# Patient Record
Sex: Male | Born: 1955 | Race: White | Hispanic: No | Marital: Married | Smoking: Current every day smoker
Health system: Southern US, Community
[De-identification: ages and names within clinical notes are randomized; demographics above are authoritative.]

## PROBLEM LIST (undated history)

## (undated) DIAGNOSIS — F329 Major depressive disorder, single episode, unspecified: Secondary | ICD-10-CM

## (undated) DIAGNOSIS — F32A Depression, unspecified: Secondary | ICD-10-CM

## (undated) DIAGNOSIS — E785 Hyperlipidemia, unspecified: Secondary | ICD-10-CM

## (undated) DIAGNOSIS — F419 Anxiety disorder, unspecified: Secondary | ICD-10-CM

## (undated) DIAGNOSIS — IMO0001 Reserved for inherently not codable concepts without codable children: Secondary | ICD-10-CM

## (undated) DIAGNOSIS — F1020 Alcohol dependence, uncomplicated: Secondary | ICD-10-CM

## (undated) DIAGNOSIS — I10 Essential (primary) hypertension: Secondary | ICD-10-CM

## (undated) DIAGNOSIS — B1910 Unspecified viral hepatitis B without hepatic coma: Secondary | ICD-10-CM

## (undated) DIAGNOSIS — K635 Polyp of colon: Secondary | ICD-10-CM

## (undated) HISTORY — DX: Reserved for inherently not codable concepts without codable children: IMO0001

## (undated) HISTORY — PX: WISDOM TOOTH EXTRACTION: SHX21

## (undated) HISTORY — DX: Depression, unspecified: F32.A

## (undated) HISTORY — DX: Polyp of colon: K63.5

## (undated) HISTORY — DX: Unspecified viral hepatitis B without hepatic coma: B19.10

## (undated) HISTORY — DX: Anxiety disorder, unspecified: F41.9

## (undated) HISTORY — DX: Essential (primary) hypertension: I10

## (undated) HISTORY — DX: Alcohol dependence, uncomplicated: F10.20

## (undated) HISTORY — DX: Hyperlipidemia, unspecified: E78.5

## (undated) HISTORY — DX: Major depressive disorder, single episode, unspecified: F32.9

---
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
## 2001-03-31 ENCOUNTER — Emergency Department (HOSPITAL_COMMUNITY): Admission: EM | Admit: 2001-03-31 | Discharge: 2001-03-31 | Payer: Self-pay | Admitting: Emergency Medicine

## 2013-01-21 ENCOUNTER — Encounter: Payer: Self-pay | Admitting: Physician Assistant

## 2013-01-21 ENCOUNTER — Ambulatory Visit (INDEPENDENT_AMBULATORY_CARE_PROVIDER_SITE_OTHER): Payer: 59 | Admitting: Physician Assistant

## 2013-01-21 VITALS — BP 132/98 | HR 67 | Temp 98.1°F | Resp 18 | Ht 67.0 in | Wt 184.5 lb

## 2013-01-21 DIAGNOSIS — F341 Dysthymic disorder: Secondary | ICD-10-CM

## 2013-01-21 DIAGNOSIS — I10 Essential (primary) hypertension: Secondary | ICD-10-CM

## 2013-01-21 DIAGNOSIS — Z0000 Encounter for general adult medical examination without abnormal findings: Secondary | ICD-10-CM

## 2013-01-21 DIAGNOSIS — F329 Major depressive disorder, single episode, unspecified: Secondary | ICD-10-CM

## 2013-01-21 DIAGNOSIS — F419 Anxiety disorder, unspecified: Secondary | ICD-10-CM

## 2013-01-21 DIAGNOSIS — M62838 Other muscle spasm: Secondary | ICD-10-CM

## 2013-01-21 DIAGNOSIS — F32A Depression, unspecified: Secondary | ICD-10-CM

## 2013-01-21 LAB — CBC WITH DIFFERENTIAL/PLATELET
BASOS ABS: 0 10*3/uL (ref 0.0–0.1)
BASOS PCT: 0 % (ref 0–1)
EOS PCT: 2 % (ref 0–5)
Eosinophils Absolute: 0.1 10*3/uL (ref 0.0–0.7)
HCT: 47.3 % (ref 39.0–52.0)
HEMOGLOBIN: 16.3 g/dL (ref 13.0–17.0)
Lymphocytes Relative: 46 % (ref 12–46)
Lymphs Abs: 2.7 10*3/uL (ref 0.7–4.0)
MCH: 29.6 pg (ref 26.0–34.0)
MCHC: 34.5 g/dL (ref 30.0–36.0)
MCV: 85.8 fL (ref 78.0–100.0)
Monocytes Absolute: 0.6 10*3/uL (ref 0.1–1.0)
Monocytes Relative: 9 % (ref 3–12)
Neutro Abs: 2.6 10*3/uL (ref 1.7–7.7)
Neutrophils Relative %: 43 % (ref 43–77)
Platelets: 260 10*3/uL (ref 150–400)
RBC: 5.51 MIL/uL (ref 4.22–5.81)
RDW: 13.2 % (ref 11.5–15.5)
WBC: 6 10*3/uL (ref 4.0–10.5)

## 2013-01-21 LAB — HEMOGLOBIN A1C
HEMOGLOBIN A1C: 5.6 % (ref <5.7)
Mean Plasma Glucose: 114 mg/dL (ref <117)

## 2013-01-21 MED ORDER — CYCLOBENZAPRINE HCL 10 MG PO TABS: 10.0000 mg | ORAL_TABLET | Freq: Three times a day (TID) | ORAL | Status: DC | PRN

## 2013-01-21 MED ORDER — METOPROLOL SUCCINATE ER 25 MG PO TB24: 25.0000 mg | ORAL_TABLET | Freq: Every day | ORAL | Status: DC

## 2013-01-21 MED ORDER — ALPRAZOLAM 0.5 MG PO TABS: 0.2500 mg | ORAL_TABLET | Freq: Two times a day (BID) | ORAL | Status: DC | PRN

## 2013-01-21 NOTE — Patient Instructions (Signed)
Please obtain labs.  I will call you with your results.  Please continue medications as prescribed.  Return in 2 weeks for BP recheck.  Please give some thought to a counselor and possible referral to Psychiatrist.

## 2013-01-21 NOTE — Progress Notes (Signed)
Pre visit review using our clinic review tool, if applicable. No additional management support is needed unless otherwise documented below in the visit note/SLS  

## 2013-01-21 NOTE — Progress Notes (Signed)
Patient ID: Andrew Pollard, male   DOB: November 13, 1955, 58 y.o.   MRN: [id number]  Patient presents to clinic today to establish care.  Acute Concerns: Patient requesting refill of medications.  Chronic Issues: Hypertension -- Patient currently on Toprol-XL.  BP elevated in clinic today.  Patient has not taken medications. Patient denies chest pain, palpitations, vision changes, headache, lightheadedness, dizziness, shortness of breath.  Hx of hyperlipidemia --patient endorses being told he had high cholesterol in the past. Has not been on medication. Is due for fasting lipid panel.  Anxiety and Depression -- patient endorses history of anxiety and depression, mostly stemming from family situations. Patient denies suicidal thought or ideation. Does endorse occasional depressed mood and anhedonia. Endorses generalized anxiety. Denies panic attack. Patient has tried multiple medications in the past, including Lexapro and venlafaxine. Patient states he cannot tolerate the medicines as they called his anxiety to be increased. Endorses suicidal thought wall these medications. Refuses to take an SSRI at this time. Patient has current prescription for Xanax 0.5 mg. Patient states he takes a half to a whole tablet as prescribed. Is able to avoid use of Xanax when he is away from his family. Patient is not currently seeing a therapist. Is amenable to seeing a counselor.   Health Maintenance: Dental -- Overdue Vision -- UTD Immunizations -- Tetanus in 2010.  Declines flu shot. Colonoscopy -- 2011; found colon polyps; benign; repeat colonoscopy in 2016.  Past Medical History  Diagnosis Date  . Colon polyps   . Hypertension   . Hyperlipidemia   . Anxiety and depression   . Hepatitis B 1977  . Alcoholism /alcohol abuse     Past Surgical History  Procedure Laterality Date  . Appendectomy  1970  . Liver biopsy  1977    Hepatitis B  . Wisdom tooth extraction      No current outpatient  prescriptions on file prior to visit.   No current facility-administered medications on file prior to visit.    Allergies  Allergen Reactions  . Sulfa Antibiotics     Family History  Problem Relation Age of Onset  . Aneurysm Mother     Heart  . Hyperlipidemia Mother   . Hypertension Mother   . Epilepsy Brother   . Healthy Brother     x1  . Healthy Son     x2    History   Social History  . Marital Status: Married    Spouse Name: N/A    Number of Children: N/A  . Years of Education: N/A   Occupational History  . Not on file.   Social History Main Topics  . Smoking status: Current Every Day Smoker -- 1.50 packs/day for 30 years  . Smokeless tobacco: Never Used  . Alcohol Use: 6.0 oz/week    10 Cans of beer per week  . Drug Use: No  . Sexual Activity: No   Other Topics Concern  . Not on file   Social History Narrative  . No narrative on file   Review of Systems  Constitutional: Negative for fever and weight loss.  HENT: Negative for ear discharge, ear pain, hearing loss and tinnitus.   Eyes: Negative for blurred vision, double vision, photophobia and pain.  Respiratory: Positive for cough. Negative for hemoptysis, sputum production, shortness of breath and wheezing.   Cardiovascular: Negative for chest pain and palpitations.  Gastrointestinal: Positive for heartburn. Negative for nausea, vomiting, abdominal pain, diarrhea, constipation, blood in stool and melena.  Genitourinary: Negative  for dysuria, urgency, frequency, hematuria and flank pain.       Nocturia x 0  Neurological: Negative for dizziness, seizures, loss of consciousness and headaches.  Endo/Heme/Allergies: Negative for environmental allergies.  Psychiatric/Behavioral: Positive for depression. Negative for suicidal ideas, hallucinations and substance abuse. The patient is nervous/anxious.    BP 138/108  Pulse 67  Temp(Src) 98.1 F (36.7 C) (Oral)  Resp 18  Ht 5\' 7"  (1.702 m)  Wt 184 lb 8 oz  (83.689 kg)  BMI 28.89 kg/m2  SpO2 95%  Physical Exam  Vitals reviewed. Constitutional: He is oriented to person, place, and time and well-developed, well-nourished, and in no distress.  HENT:  Head: Normocephalic and atraumatic.  Right Ear: External ear normal.  Left Ear: External ear normal.  Nose: Nose normal.  Mouth/Throat: Oropharynx is clear and moist. No oropharyngeal exudate.  Tympanic membranes within normal limits bilaterally.  Eyes: Conjunctivae and EOM are normal. Pupils are equal, round, and reactive to light.  Neck: Neck supple.  Cardiovascular: Normal rate, regular rhythm, normal heart sounds and intact distal pulses.   Pulmonary/Chest: Effort normal and breath sounds normal. No respiratory distress. He has no wheezes. He has no rales. He exhibits no tenderness.  Abdominal: Soft. Bowel sounds are normal. He exhibits no distension and no mass. There is no tenderness. There is no rebound and no guarding.  Lymphadenopathy:    He has no cervical adenopathy.  Neurological: He is alert and oriented to person, place, and time. No cranial nerve deficit.  Skin: Skin is warm and dry. No rash noted.  Psychiatric: Affect normal.   Assessment/Plan: No problem-specific assessment & plan notes found for this encounter.

## 2013-01-22 ENCOUNTER — Telehealth: Payer: Self-pay | Admitting: Physician Assistant

## 2013-01-22 ENCOUNTER — Ambulatory Visit: Payer: Self-pay | Admitting: Physician Assistant

## 2013-01-22 LAB — BASIC METABOLIC PANEL
BUN: 14 mg/dL (ref 6–23)
CO2: 27 mEq/L (ref 19–32)
CREATININE: 0.86 mg/dL (ref 0.50–1.35)
Calcium: 9.3 mg/dL (ref 8.4–10.5)
Chloride: 103 mEq/L (ref 96–112)
Glucose, Bld: 80 mg/dL (ref 70–99)
Potassium: 4.1 mEq/L (ref 3.5–5.3)
Sodium: 140 mEq/L (ref 135–145)

## 2013-01-22 LAB — HEPATIC FUNCTION PANEL
ALBUMIN: 4.5 g/dL (ref 3.5–5.2)
ALK PHOS: 58 U/L (ref 39–117)
ALT: 22 U/L (ref 0–53)
AST: 19 U/L (ref 0–37)
BILIRUBIN TOTAL: 0.8 mg/dL (ref 0.3–1.2)
Bilirubin, Direct: 0.2 mg/dL (ref 0.0–0.3)
Indirect Bilirubin: 0.6 mg/dL (ref 0.0–0.9)
Total Protein: 7.1 g/dL (ref 6.0–8.3)

## 2013-01-22 LAB — TSH: TSH: 2.882 u[IU]/mL (ref 0.350–4.500)

## 2013-01-22 LAB — LIPID PANEL
CHOL/HDL RATIO: 4.4 ratio
Cholesterol: 159 mg/dL (ref 0–200)
HDL: 36 mg/dL — AB (ref >39)
LDL Cholesterol: 97 mg/dL (ref 0–99)
TRIGLYCERIDES: 131 mg/dL (ref <150)
VLDL: 26 mg/dL (ref 0–40)

## 2013-01-22 LAB — URINALYSIS, ROUTINE W REFLEX MICROSCOPIC
Bilirubin Urine: NEGATIVE
Glucose, UA: NEGATIVE mg/dL
Hgb urine dipstick: NEGATIVE
KETONES UR: NEGATIVE mg/dL
Leukocytes, UA: NEGATIVE
Nitrite: NEGATIVE
PROTEIN: NEGATIVE mg/dL
SPECIFIC GRAVITY, URINE: 1.016 (ref 1.005–1.030)
UROBILINOGEN UA: 0.2 mg/dL (ref 0.0–1.0)
pH: 6 (ref 5.0–8.0)

## 2013-01-22 LAB — PSA: PSA: 2.09 ng/mL (ref <=4.00)

## 2013-01-22 NOTE — Telephone Encounter (Signed)
Relevant patient education assigned to patient using Emmi. ° °

## 2013-01-24 DIAGNOSIS — Z0000 Encounter for general adult medical examination without abnormal findings: Secondary | ICD-10-CM | POA: Insufficient documentation

## 2013-01-24 DIAGNOSIS — F329 Major depressive disorder, single episode, unspecified: Secondary | ICD-10-CM | POA: Insufficient documentation

## 2013-01-24 DIAGNOSIS — F419 Anxiety disorder, unspecified: Secondary | ICD-10-CM

## 2013-01-24 DIAGNOSIS — M62838 Other muscle spasm: Secondary | ICD-10-CM | POA: Insufficient documentation

## 2013-01-24 DIAGNOSIS — I10 Essential (primary) hypertension: Secondary | ICD-10-CM | POA: Insufficient documentation

## 2013-01-24 DIAGNOSIS — F32A Depression, unspecified: Secondary | ICD-10-CM | POA: Insufficient documentation

## 2013-01-24 NOTE — Assessment & Plan Note (Signed)
Refill Flexeril

## 2013-01-24 NOTE — Assessment & Plan Note (Signed)
Refill Xanax. Patient given handout for counselors, to schedule an appointment. Patient refuses antidepressant therapy at present time. Discussed with patient that we typically do not prescribe Xanax as monotherapy or anxiety, without an additional medication for better long term control of symptoms.

## 2013-01-24 NOTE — Assessment & Plan Note (Signed)
Medication refill. Patient asymptomatic. Patient instructed to return to clinic in one week for nursing visit, so that we can reassess his blood pressure while on medication.

## 2013-01-24 NOTE — Assessment & Plan Note (Signed)
Health maintenance reviewed. Will obtain fasting labs.

## 2013-02-05 ENCOUNTER — Ambulatory Visit: Payer: 59 | Admitting: Physician Assistant

## 2013-02-12 ENCOUNTER — Ambulatory Visit: Payer: 59 | Admitting: Physician Assistant

## 2013-03-31 ENCOUNTER — Ambulatory Visit (INDEPENDENT_AMBULATORY_CARE_PROVIDER_SITE_OTHER): Payer: 59 | Admitting: Physician Assistant

## 2013-03-31 ENCOUNTER — Encounter: Payer: Self-pay | Admitting: Physician Assistant

## 2013-03-31 ENCOUNTER — Ambulatory Visit (HOSPITAL_BASED_OUTPATIENT_CLINIC_OR_DEPARTMENT_OTHER)
Admission: RE | Admit: 2013-03-31 | Discharge: 2013-03-31 | Disposition: A | Discharge status: Home / Self Care | Payer: 59 | Source: Ambulatory Visit | Attending: Physician Assistant | Admitting: Physician Assistant

## 2013-03-31 VITALS — BP 108/88 | HR 97 | Temp 98.3°F | Resp 16 | Ht 67.0 in | Wt 185.2 lb

## 2013-03-31 DIAGNOSIS — M542 Cervicalgia: Secondary | ICD-10-CM | POA: Diagnosis present

## 2013-03-31 DIAGNOSIS — F329 Major depressive disorder, single episode, unspecified: Secondary | ICD-10-CM

## 2013-03-31 DIAGNOSIS — F32A Depression, unspecified: Secondary | ICD-10-CM

## 2013-03-31 DIAGNOSIS — M5030 Other cervical disc degeneration, unspecified cervical region: Secondary | ICD-10-CM | POA: Insufficient documentation

## 2013-03-31 DIAGNOSIS — F341 Dysthymic disorder: Secondary | ICD-10-CM

## 2013-03-31 DIAGNOSIS — F419 Anxiety disorder, unspecified: Principal | ICD-10-CM

## 2013-03-31 MED ORDER — ALPRAZOLAM 0.5 MG PO TABS: 0.5000 mg | ORAL_TABLET | Freq: Two times a day (BID) | ORAL | Status: DC | PRN

## 2013-03-31 NOTE — Assessment & Plan Note (Signed)
DG cervical spine.  Continue Flexeril.  Alternate tylenol and ibuprofen.  Topical Aspercreme.  Patient declines Rx pain medication.

## 2013-03-31 NOTE — Progress Notes (Signed)
Patient presents to clinic today for medication management and follow-up.  Anxiety: Currently on Xanax BID prn.  Has tried numerous SSRI without success. Endorse experiencing side effects and SI with SSRIs.  Has increase stressors at home.  Current dose is not completely controlling symptoms.  Cervical Neck Pain: Hx of cervical neck pain.  Endorses imaging in the past. We do not have these records. Endorses radicular symptoms down right arm with numbness and tingling.  Denies weakness of RUE.  Denies symptoms of LUE.  Has Rx for Flexeril.  Rarely takes this. Helps some with symptoms.   Past Medical History  Diagnosis Date  . Colon polyps   . Hypertension   . Hyperlipidemia   . Anxiety and depression   . Hepatitis B 1977  . Alcoholism /alcohol abuse     Current Outpatient Prescriptions on File Prior to Visit  Medication Sig Dispense Refill  . cyclobenzaprine (FLEXERIL) 10 MG tablet Take 1 tablet (10 mg total) by mouth 3 (three) times daily as needed for muscle spasms.  30 tablet  0  . metoprolol succinate (TOPROL-XL) 25 MG 24 hr tablet Take 1 tablet (25 mg total) by mouth daily.  30 tablet  3   No current facility-administered medications on file prior to visit.    Allergies  Allergen Reactions  . Sulfa Antibiotics     Family History  Problem Relation Age of Onset  . Aneurysm Mother     Heart  . Hyperlipidemia Mother   . Hypertension Mother   . Epilepsy Brother   . Healthy Brother     x1  . Healthy Son     x2    History   Social History  . Marital Status: Married    Spouse Name: N/A    Number of Children: N/A  . Years of Education: N/A   Social History Main Topics  . Smoking status: Current Every Day Smoker -- 1.50 packs/day for 30 years  . Smokeless tobacco: Never Used  . Alcohol Use: 6.0 oz/week    10 Cans of beer per week  . Drug Use: No  . Sexual Activity: No   Other Topics Concern  . None   Social History Narrative  . None   Review of Systems -  See HPI.  All other ROS are negative.  BP 108/88  Pulse 97  Temp(Src) 98.3 F (36.8 C) (Oral)  Resp 16  Ht _0  (1.702 m)  Wt 185 lb 4 oz (84.029 kg)  BMI 29.01 kg/m2  SpO2 98%  Physical Exam  Vitals reviewed. Constitutional: He is oriented to person, place, and time and well-developed, well-nourished, and in no distress.  HENT:  Head: Normocephalic and atraumatic.  Eyes: Conjunctivae are normal.  Neck: Normal range of motion.  Cardiovascular: Normal rate, regular rhythm, normal heart sounds and intact distal pulses.   Pulmonary/Chest: Effort normal and breath sounds normal.  Musculoskeletal:       Cervical back: He exhibits tenderness and spasm. He exhibits normal range of motion, no bony tenderness and no pain.  Neurological: He is alert and oriented to person, place, and time. No cranial nerve deficit.  Skin: Skin is warm and dry. No rash noted.  Psychiatric: Affect normal.    Recent Results (from the past 2160 hour(s))  CBC WITH DIFFERENTIAL     Status: None   Collection Time    01/21/13  1:42 PM      Result Value Ref Range   WBC 6.0  4.0 -  10.5 K/uL   RBC 5.51  4.22 - 5.81 MIL/uL   Hemoglobin 16.3  13.0 - 17.0 g/dL   HCT 47.3  39.0 - 52.0 %   MCV 85.8  78.0 - 100.0 fL   MCH 29.6  26.0 - 34.0 pg   MCHC 34.5  30.0 - 36.0 g/dL   RDW 13.2  11.5 - 15.5 %   Platelets 260  150 - 400 K/uL   Neutrophils Relative % 43  43 - 77 %   Neutro Abs 2.6  1.7 - 7.7 K/uL   Lymphocytes Relative 46  12 - 46 %   Lymphs Abs 2.7  0.7 - 4.0 K/uL   Monocytes Relative 9  3 - 12 %   Monocytes Absolute 0.6  0.1 - 1.0 K/uL   Eosinophils Relative 2  0 - 5 %   Eosinophils Absolute 0.1  0.0 - 0.7 K/uL   Basophils Relative 0  0 - 1 %   Basophils Absolute 0.0  0.0 - 0.1 K/uL   Smear Review Criteria for review not met    BASIC METABOLIC PANEL     Status: None   Collection Time    01/21/13  1:42 PM      Result Value Ref Range   Sodium 140  135 - 145 mEq/L   Potassium 4.1  3.5 - 5.3 mEq/L    Chloride 103  96 - 112 mEq/L   CO2 27  19 - 32 mEq/L   Glucose, Bld 80  70 - 99 mg/dL   BUN 14  6 - 23 mg/dL   Creat 0.86  0.50 - 1.35 mg/dL   Calcium 9.3  8.4 - 10.5 mg/dL  TSH     Status: None   Collection Time    01/21/13  1:42 PM      Result Value Ref Range   TSH 2.882  0.350 - 4.500 uIU/mL  HEMOGLOBIN A1C     Status: None   Collection Time    01/21/13  1:42 PM      Result Value Ref Range   Hemoglobin A1C 5.6  <5.7 %   Comment:                                                                            According to the ADA Clinical Practice Recommendations for 2011, when     HbA1c is used as a screening test:             >=6.5%   Diagnostic of Diabetes Mellitus                (if abnormal result is confirmed)           5.7-6.4%   Increased risk of developing Diabetes Mellitus           References:Diagnosis and Classification of Diabetes Mellitus,Diabetes     LDJT,7017,79(TJQZE 1):S62-S69 and Standards of Medical Care in             Diabetes - 2011,Diabetes Care,2011,34 (Suppl 1):S11-S61.         Mean Plasma Glucose 114  <117 mg/dL  URINALYSIS, ROUTINE W REFLEX MICROSCOPIC     Status: None   Collection Time  01/21/13  1:42 PM      Result Value Ref Range   Color, Urine YELLOW  YELLOW   APPearance CLEAR  CLEAR   Specific Gravity, Urine 1.016  1.005 - 1.030   pH 6.0  5.0 - 8.0   Glucose, UA NEG  NEG mg/dL   Bilirubin Urine NEG  NEG   Ketones, ur NEG  NEG mg/dL   Hgb urine dipstick NEG  NEG   Protein, ur NEG  NEG mg/dL   Urobilinogen, UA 0.2  0.0 - 1.0 mg/dL   Nitrite NEG  NEG   Leukocytes, UA NEG  NEG  PSA     Status: None   Collection Time    01/21/13  1:42 PM      Result Value Ref Range   PSA 2.09  <=4.00 ng/mL   Comment: Test Methodology: ECLIA PSA (Electrochemiluminescence Immunoassay)           For PSA values from 2.5-4.0, particularly in younger men <60 years     old, the AUA and NCCN suggest testing for % Free PSA (3515) and     evaluation of the rate of  increase in PSA (PSA velocity).  LIPID PANEL     Status: Abnormal   Collection Time    01/21/13  1:42 PM      Result Value Ref Range   Cholesterol 159  0 - 200 mg/dL   Comment: ATP III Classification:           < 200        mg/dL        Desirable          200 - 239     mg/dL        Borderline High          >= 240        mg/dL        High         Triglycerides 131  <150 mg/dL   HDL 36 (*) >39 mg/dL   Total CHOL/HDL Ratio 4.4     VLDL 26  0 - 40 mg/dL   LDL Cholesterol 97  0 - 99 mg/dL   Comment:       Total Cholesterol/HDL Ratio:CHD Risk                            Coronary Heart Disease Risk Table                                            Men       Women              1/2 Average Risk              3.4        3.3                  Average Risk              5.0        4.4               2X Average Risk              9.6        7.1  3X Average Risk             23.4       11.0     Use the calculated Patient Ratio above and the CHD Risk table      to determine the patient's CHD Risk.     ATP III Classification (LDL):           < 100        mg/dL         Optimal          100 - 129     mg/dL         Near or Above Optimal          130 - 159     mg/dL         Borderline High          160 - 189     mg/dL         High           > 190        mg/dL         Very High        HEPATIC FUNCTION PANEL     Status: None   Collection Time    01/21/13  1:42 PM      Result Value Ref Range   Total Bilirubin 0.8  0.3 - 1.2 mg/dL   Bilirubin, Direct 0.2  0.0 - 0.3 mg/dL   Indirect Bilirubin 0.6  0.0 - 0.9 mg/dL   Alkaline Phosphatase 58  39 - 117 U/L   AST 19  0 - 37 U/L   ALT 22  0 - 53 U/L   Total Protein 7.1  6.0 - 8.3 g/dL   Albumin 4.5  3.5 - 5.2 g/dL   Assessment/Plan: Anxiety and depression Increase Xanax to 0.5 mg BID.    Neck pain DG cervical spine.  Continue Flexeril.  Alternate tylenol and ibuprofen.  Topical Aspercreme.  Patient declines Rx pain medication.

## 2013-03-31 NOTE — Assessment & Plan Note (Signed)
Increase Xanax to 0.5 mg BID.

## 2013-03-31 NOTE — Progress Notes (Signed)
Pre visit review using our clinic review tool, if applicable. No additional management support is needed unless otherwise documented below in the visit note/SLS  

## 2013-04-01 ENCOUNTER — Telehealth: Payer: Self-pay | Admitting: Physician Assistant

## 2013-04-01 MED ORDER — METHOCARBAMOL 500 MG PO TABS: 500.0000 mg | ORAL_TABLET | Freq: Three times a day (TID) | ORAL | Status: DC

## 2013-04-01 NOTE — Telephone Encounter (Signed)
Rx robaxin TID.  Should help without being drowsy.

## 2013-04-01 NOTE — Telephone Encounter (Signed)
LMOM with contact name and number [for return call, if needed] RE: requested new medication and further provider instructions/SLS

## 2013-04-01 NOTE — Telephone Encounter (Signed)
Message copied by Marcelline MatesMARTIN, Tamie Minteer on Thu Apr 01, 2013 11:17 AM ------      Message from: Regis BillSCATES, SHARON L      Created: Thu Apr 01, 2013 10:58 AM       Patient informed, understood. Patient would like to inquire about an alternative to the Flexeril that he can take during the day [drowsiness at work]/SLS      Please Advise.                  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  Reveals loss of disc space between certain vertebra that could contribute to his radicular symptoms.  Continue Flexeril.  Alternate tylenol and ibuprofen.  Might gain some benefit from physical therapy.  If "flare=ups" are becoming more frequent, will need to send him to see ortho.       ------

## 2013-05-25 ENCOUNTER — Telehealth: Payer: Self-pay | Admitting: *Deleted

## 2013-05-25 DIAGNOSIS — F329 Major depressive disorder, single episode, unspecified: Secondary | ICD-10-CM

## 2013-05-25 DIAGNOSIS — F32A Depression, unspecified: Secondary | ICD-10-CM

## 2013-05-25 DIAGNOSIS — F419 Anxiety disorder, unspecified: Principal | ICD-10-CM

## 2013-05-25 NOTE — Telephone Encounter (Signed)
Received message from pt requesting refill of Xanax.  Last rx printed 03/31/13, #60. Pt last seen 03/31/13 and has no future appts on file.  Please advise.

## 2013-05-26 ENCOUNTER — Other Ambulatory Visit: Payer: Self-pay | Admitting: Physician Assistant

## 2013-05-26 MED ORDER — ALPRAZOLAM 0.5 MG PO TABS: 0.5000 mg | ORAL_TABLET | Freq: Two times a day (BID) | ORAL | Status: DC | PRN

## 2013-05-26 NOTE — Telephone Encounter (Addendum)
[facility]

## 2013-05-26 NOTE — Telephone Encounter (Signed)
Refill granted.  Printed, signed and given to CMA for fax or pick up.

## 2013-05-26 NOTE — Telephone Encounter (Signed)
Rx request to pharmacy/SLS  

## 2013-05-27 ENCOUNTER — Ambulatory Visit: Payer: 59 | Admitting: Physician Assistant

## 2013-06-11 ENCOUNTER — Ambulatory Visit: Payer: 59 | Admitting: Physician Assistant

## 2013-07-09 ENCOUNTER — Ambulatory Visit (INDEPENDENT_AMBULATORY_CARE_PROVIDER_SITE_OTHER): Payer: 59 | Admitting: Physician Assistant

## 2013-07-09 ENCOUNTER — Encounter: Payer: Self-pay | Admitting: Physician Assistant

## 2013-07-09 VITALS — BP 114/84 | HR 65 | Temp 97.8°F | Resp 16 | Ht 67.0 in | Wt 181.0 lb

## 2013-07-09 DIAGNOSIS — M25579 Pain in unspecified ankle and joints of unspecified foot: Secondary | ICD-10-CM

## 2013-07-09 NOTE — Patient Instructions (Signed)
Please continue Robaxin for muscle spasms.  I do not think what you experienced was a side effect of the medicine.  However, if it recurs, please let me know.  For your feet -- make sure you are wearing supportive shoes.  Get some orthotics to put in your shoes.  Ibuprofen if needed for pain.  For continued symptoms we will need imaging and possible to set you up with a Podiatrist.    Remember to ask your insurance representative about the pneumonia shot.

## 2013-07-09 NOTE — Assessment & Plan Note (Signed)
Could be arthritis related.  No gross abnormality palpable.  History does not seem indicative of stress fracture.  Recommend orthoptic insoles. Also recommend podiatry input.  Patient defers at present.

## 2013-07-09 NOTE — Progress Notes (Signed)
Patient presents to clinic today c/o pain of the dorsal aspect of feet bilaterally at the location of the third phalanges.  Denies trauma or injury.  States it feels like a knot under his feet.  Denies swelling, redness or decreased ROM.   Past Medical History  Diagnosis Date  . Colon polyps   . Hypertension   . Hyperlipidemia   . Anxiety and depression   . Hepatitis B 1977  . Alcoholism /alcohol abuse     Current Outpatient Prescriptions on File Prior to Visit  Medication Sig Dispense Refill  . ALPRAZolam (XANAX) 0.5 MG tablet Take 1 tablet (0.5 mg total) by mouth 2 (two) times daily as needed for anxiety.  60 tablet  0  . cyclobenzaprine (FLEXERIL) 10 MG tablet Take 1 tablet (10 mg total) by mouth 3 (three) times daily as needed for muscle spasms.  30 tablet  0  . metoprolol succinate (TOPROL-XL) 25 MG 24 hr tablet TAKE 1 TABLET BY MOUTH DAILY  30 tablet  3   No current facility-administered medications on file prior to visit.    Allergies  Allergen Reactions  . Sulfa Antibiotics     Family History  Problem Relation Age of Onset  . Aneurysm Mother     Heart  . Hyperlipidemia Mother   . Hypertension Mother   . Epilepsy Brother   . Healthy Brother     x1  . Healthy Son     x2  . Cancer Father 1085    Stage IV Liver Cancer with mets to Colon    History   Social History  . Marital Status: Married    Spouse Name: N/A    Number of Children: N/A  . Years of Education: N/A   Social History Main Topics  . Smoking status: Current Every Day Smoker -- 1.50 packs/day for 30 years  . Smokeless tobacco: Never Used  . Alcohol Use: 6.0 oz/week    10 Cans of beer per week  . Drug Use: No  . Sexual Activity: No   Other Topics Concern  . None   Social History Narrative  . None   Review of Systems - See HPI.  All other ROS are negative.  BP 114/84  Pulse 65  Temp(Src) 97.8 F (36.6 C) (Oral)  Resp 16  Ht 5\' 7"  (1.702 m)  Wt 181 lb (82.101 kg)  BMI 28.34 kg/m2  SpO2  96%  Physical Exam  Vitals reviewed. Constitutional: He is oriented to person, place, and time.  Cardiovascular: Normal rate, regular rhythm, normal heart sounds and intact distal pulses.   Pulmonary/Chest: Effort normal and breath sounds normal. No respiratory distress. He has no wheezes. He has no rales. He exhibits no tenderness.  Musculoskeletal:       Right foot: He exhibits tenderness. He exhibits normal range of motion, no bony tenderness, no swelling, normal capillary refill, no crepitus, no deformity and no laceration.       Left foot: He exhibits tenderness. He exhibits normal range of motion, no bony tenderness, no swelling, normal capillary refill, no crepitus, no deformity and no laceration.  There is tenderness noted with palpation of the dorsal aspect of the third MTP joints bilaterally without palpable abnormally.  Neurological: He is alert and oriented to person, place, and time.  Skin: Skin is warm and dry. No rash noted.   Assessment/Plan: Foot joint pain Could be arthritis related.  No gross abnormality palpable.  History does not seem indicative of stress fracture.  Recommend orthoptic insoles. Also recommend podiatry input.  Patient defers at present.

## 2013-07-09 NOTE — Progress Notes (Signed)
Pre visit review using our clinic review tool, if applicable. No additional management support is needed unless otherwise documented below in the visit note/SLS  

## 2013-07-30 ENCOUNTER — Telehealth: Payer: Self-pay | Admitting: *Deleted

## 2013-07-30 DIAGNOSIS — F32A Depression, unspecified: Secondary | ICD-10-CM

## 2013-07-30 DIAGNOSIS — F329 Major depressive disorder, single episode, unspecified: Secondary | ICD-10-CM

## 2013-07-30 DIAGNOSIS — F419 Anxiety disorder, unspecified: Principal | ICD-10-CM

## 2013-07-30 MED ORDER — ALPRAZOLAM 0.5 MG PO TABS: 0.5000 mg | ORAL_TABLET | Freq: Two times a day (BID) | ORAL | Status: DC | PRN

## 2013-07-30 NOTE — Telephone Encounter (Signed)
Refill printed, signed and given to CMA to be faxed.

## 2013-07-30 NOTE — Telephone Encounter (Signed)
Refill request for Alprazolam 0.5 mg Last filled by MD on - 05.13.15, #60x0 Last AEX - 06.26.15 Next AEX - Not Given Please Advise on refills/SLS

## 2013-07-30 NOTE — Telephone Encounter (Signed)
Rx faxed to pharmacy; Patient informed/SLS

## 2013-09-16 ENCOUNTER — Telehealth: Payer: Self-pay | Admitting: Physician Assistant

## 2013-09-16 DIAGNOSIS — F32A Depression, unspecified: Secondary | ICD-10-CM

## 2013-09-16 DIAGNOSIS — F419 Anxiety disorder, unspecified: Principal | ICD-10-CM

## 2013-09-16 DIAGNOSIS — F329 Major depressive disorder, single episode, unspecified: Secondary | ICD-10-CM

## 2013-09-16 MED ORDER — ALPRAZOLAM 0.5 MG PO TABS: 0.5000 mg | ORAL_TABLET | Freq: Two times a day (BID) | ORAL | Status: DC | PRN

## 2013-09-16 NOTE — Telephone Encounter (Signed)
Prescription sent / faxed to pts pharmacy

## 2013-09-16 NOTE — Telephone Encounter (Signed)
Refill granted.  Same quantity with 2 refills. Will have patient sign CSC at next visit. Please let patient know this has been done.

## 2013-09-16 NOTE — Telephone Encounter (Signed)
rx refill - xanax 0.5mg  Last Ov- 07/09/13 Last refilled- 07/30/13 # 60 / 0 rf  Last UDS- none.

## 2013-09-16 NOTE — Telephone Encounter (Signed)
Caller name: Jakaleb  Call back number:[phone] Pharmacy: [facility] in [place]  Reason for call:  Pt is requesting a refill on Xanax. Please send to above pharmacy. Call when complete.

## 2013-10-10 ENCOUNTER — Other Ambulatory Visit: Payer: Self-pay | Admitting: Physician Assistant

## 2013-10-10 DIAGNOSIS — I10 Essential (primary) hypertension: Secondary | ICD-10-CM

## 2013-12-24 ENCOUNTER — Ambulatory Visit (INDEPENDENT_AMBULATORY_CARE_PROVIDER_SITE_OTHER): Payer: 59 | Admitting: Physician Assistant

## 2013-12-24 ENCOUNTER — Encounter: Payer: Self-pay | Admitting: Physician Assistant

## 2013-12-24 VITALS — BP 122/83 | HR 75 | Temp 98.2°F | Wt 183.0 lb

## 2013-12-24 DIAGNOSIS — J0100 Acute maxillary sinusitis, unspecified: Secondary | ICD-10-CM

## 2013-12-24 DIAGNOSIS — M5090 Cervical disc disorder, unspecified, unspecified cervical region: Secondary | ICD-10-CM

## 2013-12-24 DIAGNOSIS — K047 Periapical abscess without sinus: Secondary | ICD-10-CM

## 2013-12-24 DIAGNOSIS — M62838 Other muscle spasm: Secondary | ICD-10-CM

## 2013-12-24 MED ORDER — TRAMADOL HCL 50 MG PO TABS: 50.0000 mg | ORAL_TABLET | Freq: Three times a day (TID) | ORAL | Status: DC | PRN

## 2013-12-24 MED ORDER — AMOXICILLIN-POT CLAVULANATE 875-125 MG PO TABS: 1.0000 | ORAL_TABLET | Freq: Two times a day (BID) | ORAL | Status: DC

## 2013-12-24 MED ORDER — METHYLPREDNISOLONE ACETATE 40 MG/ML IJ SUSP
40.0000 mg | Freq: Once | INTRAMUSCULAR | Status: AC
  Administered 2013-12-24: 40 mg via INTRAMUSCULAR

## 2013-12-24 MED ORDER — CYCLOBENZAPRINE HCL 10 MG PO TABS: 10.0000 mg | ORAL_TABLET | Freq: Three times a day (TID) | ORAL | Status: DC | PRN

## 2013-12-24 NOTE — Patient Instructions (Signed)
For sinuses and ear infection -- Take antibiotic as directed with food.  This will also help with mild dental infection.  Stay well hydrated and get plenty of rest. Use plain Mucinex for congestion.  Use Saline nasal spray to flush out sinuses.  For the neck -- Take Flexeril as directed but do not use before driving or operating heavy machinery. Tramadol for breakthrough pain.  You will be contacted to schedule an MRI. Once we have the results we will know whether and Orthopedic Surgeon or Neurosurgeon is needed to help manage your symptoms.  Sinusitis Sinusitis is redness, soreness, and inflammation of the paranasal sinuses. Paranasal sinuses are air pockets within the bones of your face (beneath the eyes, the middle of the forehead, or above the eyes). In healthy paranasal sinuses, mucus is able to drain out, and air is able to circulate through them by way of your nose. However, when your paranasal sinuses are inflamed, mucus and air can become trapped. This can allow bacteria and other germs to grow and cause infection. Sinusitis can develop quickly and last only a short time (acute) or continue over a long period (chronic). Sinusitis that lasts for more than 12 weeks is considered chronic.  CAUSES  Causes of sinusitis include:  Allergies.  Structural abnormalities, such as displacement of the cartilage that separates your nostrils (deviated septum), which can decrease the air flow through your nose and sinuses and affect sinus drainage.  Functional abnormalities, such as when the small hairs (cilia) that line your sinuses and help remove mucus do not work properly or are not present. SIGNS AND SYMPTOMS  Symptoms of acute and chronic sinusitis are the same. The primary symptoms are pain and pressure around the affected sinuses. Other symptoms include:  Upper toothache.  Earache.  Headache.  Bad breath.  Decreased sense of smell and taste.  A cough, which worsens when you are lying  flat.  Fatigue.  Fever.  Thick drainage from your nose, which often is green and may contain pus (purulent).  Swelling and warmth over the affected sinuses. DIAGNOSIS  Your health care provider will perform a physical exam. During the exam, your health care provider may:  Look in your nose for signs of abnormal growths in your nostrils (nasal polyps).  Tap over the affected sinus to check for signs of infection.  View the inside of your sinuses (endoscopy) using an imaging device that has a light attached (endoscope). If your health care provider suspects that you have chronic sinusitis, one or more of the following tests may be recommended:  Allergy tests.  Nasal culture. A sample of mucus is taken from your nose, sent to a lab, and screened for bacteria.  Nasal cytology. A sample of mucus is taken from your nose and examined by your health care provider to determine if your sinusitis is related to an allergy. TREATMENT  Most cases of acute sinusitis are related to a viral infection and will resolve on their own within 10 days. Sometimes medicines are prescribed to help relieve symptoms (pain medicine, decongestants, nasal steroid sprays, or saline sprays).  However, for sinusitis related to a bacterial infection, your health care provider will prescribe antibiotic medicines. These are medicines that will help kill the bacteria causing the infection.  Rarely, sinusitis is caused by a fungal infection. In theses cases, your health care provider will prescribe antifungal medicine. For some cases of chronic sinusitis, surgery is needed. Generally, these are cases in which sinusitis recurs more than  3 times per year, despite other treatments. HOME CARE INSTRUCTIONS   Drink plenty of water. Water helps thin the mucus so your sinuses can drain more easily.  Use a humidifier.  Inhale steam 3 to 4 times a day (for example, sit in the bathroom with the shower running).  Apply a warm,  moist washcloth to your face 3 to 4 times a day, or as directed by your health care provider.  Use saline nasal sprays to help moisten and clean your sinuses.  Take medicines only as directed by your health care provider.  If you were prescribed either an antibiotic or antifungal medicine, finish it all even if you start to feel better. SEEK IMMEDIATE MEDICAL CARE IF:  You have increasing pain or severe headaches.  You have nausea, vomiting, or drowsiness.  You have swelling around your face.  You have vision problems.  You have a stiff neck.  You have difficulty breathing. MAKE SURE YOU:   Understand these instructions.  Will watch your condition.  Will get help right away if you are not doing well or get worse. Document Released: 12/31/2004 Document Revised: 05/17/2013 Document Reviewed: 01/15/2011 [facility] Patient Information 2015 [place], [place]. This information is not intended to replace advice given to you by your health care provider. Make sure you discuss any questions you have with your health care provider.

## 2013-12-24 NOTE — Progress Notes (Signed)
Patient presents to clinic today c/o sinus pressure, sinus pain, ear pain, chest congestion and productive cough over the past week, worsening over the past 2-3 days. Denies fever, chills.  Denies recent travel or sick contact.  Patient endorses persistent pain in neck with intermittent radiation into bilateral upper extremities.  Has diagnosed cervical disc disease via x-ray in March of this year.  Denies numbness or weakness of extremities.  Past Medical History  Diagnosis Date  . Colon polyps   . Hypertension   . Hyperlipidemia   . Anxiety and depression   . Hepatitis B 1977  . Alcoholism /alcohol abuse     Current Outpatient Prescriptions on File Prior to Visit  Medication Sig Dispense Refill  . ALPRAZolam (XANAX) 0.5 MG tablet Take 1 tablet (0.5 mg total) by mouth 2 (two) times daily as needed for anxiety. 60 tablet 2  . aspirin 325 MG tablet Take 325 mg by mouth daily as needed.    . metoprolol succinate (TOPROL-XL) 25 MG 24 hr tablet TAKE 1 TABLET BY MOUTH EVERY DAY 30 tablet 2   No current facility-administered medications on file prior to visit.    Allergies  Allergen Reactions  . Sulfa Antibiotics     Family History  Problem Relation Age of Onset  . Aneurysm Mother     Heart  . Hyperlipidemia Mother   . Hypertension Mother   . Epilepsy Brother   . Healthy Brother     x1  . Healthy Son     x2  . Cancer Father [id number]    Stage IV Liver Cancer with mets to Colon    History   Social History  . Marital Status: Married    Spouse Name: N/A    Number of Children: N/A  . Years of Education: N/A   Social History Main Topics  . Smoking status: Current Every Day Smoker -- 1.50 packs/day for 30 years  . Smokeless tobacco: Never Used  . Alcohol Use: 6.0 oz/week    10 Cans of beer per week  . Drug Use: No  . Sexual Activity: No   Other Topics Concern  . None   Social History Narrative   Review of Systems - See HPI.  All other ROS are negative.  BP 122/83  mmHg  Pulse 75  Temp(Src) 98.2 F (36.8 C)  Wt 183 lb (83.008 kg)  SpO2 100%  Physical Exam  Constitutional: He is oriented to person, place, and time and well-developed, well-nourished, and in no distress.  HENT:  Head: Normocephalic and atraumatic.  Right Ear: Tympanic membrane is erythematous. Tympanic membrane is not bulging.  Left Ear: Tympanic membrane, external ear and ear canal normal.  Nose: Right sinus exhibits maxillary sinus tenderness. Left sinus exhibits maxillary sinus tenderness.  Mouth/Throat: Uvula is midline, oropharynx is clear and moist and mucous membranes are normal.  Eyes: Conjunctivae are normal.  Neck: Trachea normal. Spinous process tenderness present. No muscular tenderness present.  Cardiovascular: Normal rate, regular rhythm, normal heart sounds and intact distal pulses.   Pulmonary/Chest: Effort normal and breath sounds normal. No respiratory distress. He has no wheezes. He has no rales. He exhibits no tenderness.  Neurological: He is alert and oriented to person, place, and time.  Skin: Skin is warm and dry. No rash noted.  Psychiatric: Affect normal.  Vitals reviewed.  Assessment/Plan: Cervical neck pain with evidence of disc disease Will obtain MRI to further assess. Rx tramadol for breakthrough pain. Will likely need Neurosrugery input.  Will refer once results are in.  Acute maxillary sinusitis Will initiate antibiotic therapy. Rx Augmentin. Increase fluids.  Rest. Saline nasal spray.  Mucinex.  Humidifier in bedroom.

## 2013-12-24 NOTE — Assessment & Plan Note (Signed)
Will initiate antibiotic therapy. Rx Augmentin. Increase fluids.  Rest. Saline nasal spray.  Mucinex.  Humidifier in bedroom.

## 2013-12-24 NOTE — Progress Notes (Signed)
Pre visit review using our clinic review tool, if applicable. No additional management support is needed unless otherwise documented below in the visit note. 

## 2013-12-24 NOTE — Assessment & Plan Note (Signed)
Will obtain MRI to further assess. Rx tramadol for breakthrough pain. Will likely need Neurosrugery input.  Will refer once results are in.

## 2014-01-10 ENCOUNTER — Other Ambulatory Visit: Payer: Self-pay | Admitting: Physician Assistant

## 2014-01-21 ENCOUNTER — Ambulatory Visit: Payer: 59 | Admitting: Physician Assistant

## 2014-02-09 ENCOUNTER — Other Ambulatory Visit: Payer: Self-pay | Admitting: Physician Assistant

## 2014-02-09 NOTE — Telephone Encounter (Signed)
Rx request to pharmacy/SLS  

## 2014-02-17 ENCOUNTER — Other Ambulatory Visit: Payer: Self-pay | Admitting: Physician Assistant

## 2014-02-17 NOTE — Telephone Encounter (Signed)
Last filled: 09/16/13 Amt: 60, 2 refills Last OV:  12/24/13 No contract or UDS on file  Please advise.

## 2014-02-18 NOTE — Telephone Encounter (Signed)
Handled.  Rx faxed to pharmacy.

## 2014-04-04 ENCOUNTER — Telehealth: Payer: Self-pay | Admitting: Physician Assistant

## 2014-04-04 MED ORDER — ALPRAZOLAM 0.5 MG PO TABS: 0.5000 mg | ORAL_TABLET | Freq: Two times a day (BID) | ORAL | Status: DC | PRN

## 2014-04-04 NOTE — Telephone Encounter (Signed)
Medication Detail      Disp Refills Start End     ALPRAZolam (XANAX) 0.5 MG tablet 60 tablet 0 02/18/2014     Sig: TAKE 1 TABLET BY MOUTH TWICE DAILY AS NEEDED FOR ANXIETY    Class: Print   [id number] [place] [place] - [address] [facility] [address]   LAST OV: 12.11.15 & 06.26.15 Acutes Only; 03.18.15 Anxiety/Depression ROV: Not Stated Please Advise on refills/SLS

## 2014-04-04 NOTE — Telephone Encounter (Signed)
Rx phoned in to pharmacy/SLS

## 2014-04-04 NOTE — Telephone Encounter (Signed)
Ok to refill.  Same sig with 2 refills.  Fax to pharmacy.

## 2014-04-04 NOTE — Telephone Encounter (Signed)
Caller name: Rosary LivelyCarpenter, Mannie Relation to ZO:XWRUpt:self Call back [phone] Pharmacy:  Reason for call: pt is needing rx ALPRAZolam (XANAX) 0.5 MG tablet  Please call when available for pick up.

## 2014-05-03 ENCOUNTER — Other Ambulatory Visit: Payer: Self-pay | Admitting: Physician Assistant

## 2014-05-06 ENCOUNTER — Ambulatory Visit (INDEPENDENT_AMBULATORY_CARE_PROVIDER_SITE_OTHER): Payer: 59 | Admitting: Physician Assistant

## 2014-05-06 ENCOUNTER — Encounter: Payer: Self-pay | Admitting: Physician Assistant

## 2014-05-06 VITALS — BP 110/75 | HR 66 | Temp 98.0°F | Resp 16 | Ht 67.0 in | Wt 184.0 lb

## 2014-05-06 DIAGNOSIS — B070 Plantar wart: Secondary | ICD-10-CM

## 2014-05-06 DIAGNOSIS — K047 Periapical abscess without sinus: Secondary | ICD-10-CM

## 2014-05-06 MED ORDER — AMOXICILLIN-POT CLAVULANATE 500-125 MG PO TABS: 1.0000 | ORAL_TABLET | Freq: Three times a day (TID) | ORAL | Status: DC

## 2014-05-06 NOTE — Assessment & Plan Note (Signed)
X 1. Cryotherapy applied.  Supportive measures discussed.  Will likely need repeat treatment.

## 2014-05-06 NOTE — Progress Notes (Signed)
   Patient presents to clinic today c/o pain, redness and swelling underneath two bottom teeth.  Recently saw an oral surgeon who stated he had an infection and needed teeth pulled. Patient unable to afford procedure at that time. Patient endorses pain of gums on left lower side.  Patient denies fever, chills, aches. Is able to chew and swallow.  Past Medical History  Diagnosis Date  . Colon polyps   . Hypertension   . Hyperlipidemia   . Anxiety and depression   . Hepatitis B 1977  . Alcoholism /alcohol abuse     Current Outpatient Prescriptions on File Prior to Visit  Medication Sig Dispense Refill  . ALPRAZolam (XANAX) 0.5 MG tablet Take 1 tablet (0.5 mg total) by mouth 2 (two) times daily as needed. for anxiety 60 tablet 2  . aspirin 325 MG tablet Take 325 mg by mouth daily as needed.    . cyclobenzaprine (FLEXERIL) 10 MG tablet Take 1 tablet (10 mg total) by mouth 3 (three) times daily as needed for muscle spasms. 30 tablet 0  . metoprolol succinate (TOPROL-XL) 25 MG 24 hr tablet TAKE 1 TABLET BY MOUTH EVERY DAY 30 tablet 5   No current facility-administered medications on file prior to visit.    Allergies  Allergen Reactions  . Sulfa Antibiotics     Family History  Problem Relation Age of Onset  . Aneurysm Mother     Heart  . Hyperlipidemia Mother   . Hypertension Mother   . Epilepsy Brother   . Healthy Brother     x1  . Healthy Son     x2  . Cancer Father [id number]    Stage IV Liver Cancer with mets to Colon    History   Social History  . Marital Status: Married    Spouse Name: N/A  . Number of Children: N/A  . Years of Education: N/A   Social History Main Topics  . Smoking status: Current Every Day Smoker -- 1.50 packs/day for 30 years  . Smokeless tobacco: Never Used  . Alcohol Use: 6.0 oz/week    10 Cans of beer per week  . Drug Use: No  . Sexual Activity: No   Other Topics Concern  . None   Social History Narrative   Review of Systems - See HPI.  All  other ROS are negative.  BP 110/75 mmHg  Pulse 66  Temp(Src) 98 F (36.7 C) (Oral)  Resp 16  Ht 5\' 7"  (1.702 m)  Wt 184 lb (83.462 kg)  BMI 28.81 kg/m2  SpO2 98%  Physical Exam  Constitutional: He is oriented to person, place, and time and well-developed, well-nourished, and in no distress.  HENT:  Head: Normocephalic and atraumatic.  Eyes: Conjunctivae are normal.  Neck: Neck supple.  Cardiovascular: Normal rate, regular rhythm, normal heart sounds and intact distal pulses.   Pulmonary/Chest: Effort normal and breath sounds normal. No respiratory distress. He has no wheezes. He has no rales. He exhibits no tenderness.  Neurological: He is alert and oriented to person, place, and time.  Skin: Skin is warm and dry. No rash noted.     Psychiatric: Affect normal.  Vitals reviewed.  Assessment/Plan: Dental infection No sign of abscess. Dental hygiene reviewed.  Recommended soft-bristled toothbrush. Floss daily. Rx Augmentin for infection. Follow-up with Dentist.   Plantar warts X 1. Cryotherapy applied.  Supportive measures discussed.  Will likely need repeat treatment.

## 2014-05-06 NOTE — Patient Instructions (Signed)
Please take antibiotic as directed with food. Avoid chewing on affected side. Continue brushing teeth with a soft-bristled toothbrush twice daily. Flossing will be beneficial. Please try to set up appointment with dentist -- you may want to try [facility].  Dental Pain Toothache is pain in or around a tooth. It may get worse with chewing or with cold or heat.  HOME CARE  Your dentist may use a numbing medicine during treatment. If so, you may need to avoid eating until the medicine wears off. Ask your dentist about this.  Only take medicine as told by your dentist or doctor.  Avoid chewing food near the painful tooth until after all treatment is done. Ask your dentist about this. GET HELP RIGHT AWAY IF:   The problem gets worse or new problems appear.  You have a fever.  There is redness and puffiness (swelling) of the face, jaw, or neck.  You cannot open your mouth.  There is pain in the jaw.  There is very bad pain that is not helped by medicine. MAKE SURE YOU:   Understand these instructions.  Will watch your condition.  Will get help right away if you are not doing well or get worse. Document Released: 06/19/2007 Document Revised: 03/25/2011 Document Reviewed: 06/19/2007 [facility] Patient Information 2015 [place], [place]. This information is not intended to replace advice given to you by your health care provider. Make sure you discuss any questions you have with your health care provider.

## 2014-05-06 NOTE — Progress Notes (Signed)
Pre visit review using our clinic review tool, if applicable. No additional management support is needed unless otherwise documented below in the visit note/SLS  

## 2014-05-06 NOTE — Assessment & Plan Note (Signed)
No sign of abscess. Dental hygiene reviewed.  Recommended soft-bristled toothbrush. Floss daily. Rx Augmentin for infection. Follow-up with Dentist.

## 2014-05-18 ENCOUNTER — Telehealth: Payer: Self-pay | Admitting: Physician Assistant

## 2014-05-18 DIAGNOSIS — K047 Periapical abscess without sinus: Secondary | ICD-10-CM

## 2014-05-18 MED ORDER — AMOXICILLIN-POT CLAVULANATE 500-125 MG PO TABS: 1.0000 | ORAL_TABLET | Freq: Three times a day (TID) | ORAL | Status: DC

## 2014-05-18 NOTE — Telephone Encounter (Signed)
Ok with refill.  Refill sent to [facility]. Take as directed.

## 2014-05-18 NOTE — Telephone Encounter (Signed)
Relation to pt: self  Call back number: [phone] [id number] [place], [place] - [address] [facility] [address] [phone] (Phone) [phone] (Fax)         Reason for call:  Pt requesting antibiotics to hold him over until dentist appointment 05/25/14.

## 2014-08-10 ENCOUNTER — Telehealth: Payer: Self-pay | Admitting: Physician Assistant

## 2014-08-10 ENCOUNTER — Other Ambulatory Visit: Payer: Self-pay | Admitting: Physician Assistant

## 2014-08-10 MED ORDER — ALPRAZOLAM 0.5 MG PO TABS: 0.5000 mg | ORAL_TABLET | Freq: Two times a day (BID) | ORAL | Status: DC | PRN

## 2014-08-10 NOTE — Telephone Encounter (Signed)
Will grant refill but he will have to pick up at front desk as he needs to give UDS sample.

## 2014-08-10 NOTE — Telephone Encounter (Signed)
Called and spoke with the pt and informed him of the note below.  Pt verbalized understanding and agreed.//AB/CMA 

## 2014-08-10 NOTE — Telephone Encounter (Signed)
Caller name: Relation to pt:  Call back number: Pharmacy:  Reason for call: patient requesting a refill ALPRAZolam (XANAX) 0.5 MG tablet

## 2014-08-10 NOTE — Telephone Encounter (Signed)
Requesting Alprazolam 0.5mg -Take 1 tablet by mouth 2 times daily as needed of anxiety. Last refill:04/04/14;#60,2 Last OV:05/06/14 UDS:Not done Please advise.//AB/CMA

## 2014-08-16 ENCOUNTER — Other Ambulatory Visit: Payer: Self-pay | Admitting: *Deleted

## 2014-08-16 NOTE — Telephone Encounter (Signed)
Received fax request for Alprazolam 0.5mg  for patient.  Per current medication list, this Rx was already sent:    Disp Refills Start End      ALPRAZolam (XANAX) 0.5 MG tablet 60 tablet 0 08/10/2014     Sig - Route: Take 1 tablet (0.5 mg total) by mouth 2 (two) times daily as needed. for anxiety - Oral    Class: Print      Called pharmacist to verify, who stated that they did not receive the Rx.   Called verbal order of above to pharmacy.  Patient notified.

## 2014-09-12 ENCOUNTER — Telehealth: Payer: Self-pay | Admitting: Physician Assistant

## 2014-09-12 ENCOUNTER — Encounter: Payer: Self-pay | Admitting: Physician Assistant

## 2014-09-12 DIAGNOSIS — M62838 Other muscle spasm: Secondary | ICD-10-CM

## 2014-09-12 MED ORDER — CYCLOBENZAPRINE HCL 10 MG PO TABS: 10.0000 mg | ORAL_TABLET | Freq: Three times a day (TID) | ORAL | Status: DC | PRN

## 2014-09-12 MED ORDER — ALPRAZOLAM 0.5 MG PO TABS: 0.5000 mg | ORAL_TABLET | Freq: Two times a day (BID) | ORAL | Status: DC | PRN

## 2014-09-12 NOTE — Telephone Encounter (Signed)
Patient informed, understood & agreed; states he will p/u Friday/SLS

## 2014-09-12 NOTE — Telephone Encounter (Signed)
Flexeril RX sent in. Xanax refilled but will have to pick up as he needs to sign new CSC with new pharmacy listed.

## 2014-09-12 NOTE — Telephone Encounter (Signed)
Relation to pt: self  Call back number:[phone]  Pharmacy: [facility]   Reason for call:  Patient requesting a refill cyclobenzaprine (FLEXERIL) 10 MG tablet  [facility] to [facility] patient also requesting a refill ALPRAZolam (XANAX) 0.5 MG tablet

## 2014-09-12 NOTE — Telephone Encounter (Signed)
Medication Detail      Disp Refills Start End     cyclobenzaprine (FLEXERIL) 10 MG tablet 30 tablet 0 12/24/2013     Sig - Route: Take 1 tablet (10 mg total) by mouth 3 (three) times daily as needed for muscle spasms. - Oral    E-Prescribing Status: Receipt confirmed by pharmacy (12/24/2013 7:22 AM EST)   Associated Diagnoses    Muscle spasm      Pharmacy    [id number] - [place], [place] - [address] [facility] [address]   Medication Detail      Disp Refills Start End     ALPRAZolam (XANAX) 0.5 MG tablet 60 tablet 0 08/10/2014     Sig - Route: Take 1 tablet (0.5 mg total) by mouth 2 (two) times daily as needed. for anxiety - Oral    Class: Print   Pharmacy    [id number] - [place], [place] - [address] AT [facility] [address]   Patient's [facility] designated pharmacy is [facility] [place], requesting Rx[facility]s] be [facility]; Please Advise/SLS

## 2014-09-23 ENCOUNTER — Telehealth: Payer: Self-pay | Admitting: Physician Assistant

## 2014-09-23 ENCOUNTER — Encounter: Payer: 59 | Admitting: Medical

## 2014-09-23 NOTE — Telephone Encounter (Signed)
How often has he no showed in the past. If this is rare occurrence then no charge. If he has no showed a lot then charge. Do want to be merciful but if consecutive no shows then charge.

## 2014-09-23 NOTE — Telephone Encounter (Signed)
Patient states he does not have copay and does not have money for medication so he will take his chances on getting better. Advised patient his co pay can be billed he said financial he is tight. Charge or no charge

## 2014-09-23 NOTE — Progress Notes (Signed)
This encounter was created in error - please disregard.

## 2014-09-27 NOTE — Telephone Encounter (Signed)
I do not see history of no shows. Will mark for no charge.

## 2014-10-21 ENCOUNTER — Telehealth: Payer: Self-pay | Admitting: Physician Assistant

## 2014-10-21 MED ORDER — ALPRAZOLAM 0.5 MG PO TABS: 0.5000 mg | ORAL_TABLET | Freq: Two times a day (BID) | ORAL | Status: DC | PRN

## 2014-10-21 NOTE — Telephone Encounter (Signed)
Medication Detail      Disp Refills Start End     ALPRAZolam (XANAX) 0.5 MG tablet 60 tablet 0 09/12/2014     Sig - Route: Take 1 tablet (0.5 mg total) by mouth 2 (two) times daily as needed. for anxiety - Oral    Class: Print     Pharmacy    [id number] - [place], [place] - [address] AT [facility] [address]     Rx request to pharmacy per provider VO/SLS Called patient to inform and he requested to have Rx [facility] to [facility], informed they will not be in until Monday and he agreed; also, pt agreed to make f/u appointment as soon as he can get day off from work [employer] 7 days at this time]; called [facility] and canceled order/SLS

## 2014-10-21 NOTE — Telephone Encounter (Signed)
Relation to pt: self Call back number: [phone] [facility] [phone]   Reason for call:  Patient requesting a refill ALPRAZolam (XANAX) 0.5 MG tablet

## 2014-11-11 ENCOUNTER — Ambulatory Visit (INDEPENDENT_AMBULATORY_CARE_PROVIDER_SITE_OTHER): Payer: 59 | Admitting: Physician Assistant

## 2014-11-11 ENCOUNTER — Encounter: Payer: Self-pay | Admitting: Physician Assistant

## 2014-11-11 VITALS — BP 138/86 | HR 79 | Temp 97.9°F | Ht 67.0 in | Wt 184.8 lb

## 2014-11-11 DIAGNOSIS — I10 Essential (primary) hypertension: Secondary | ICD-10-CM | POA: Diagnosis not present

## 2014-11-11 MED ORDER — METOPROLOL SUCCINATE ER 25 MG PO TB24: 25.0000 mg | ORAL_TABLET | Freq: Every day | ORAL | Status: DC

## 2014-11-11 NOTE — Patient Instructions (Signed)
Please continue your medications as directed. Follow-up 6 months for a physical.  Hypertension Hypertension, commonly called high blood pressure, is when the force of blood pumping through your arteries is too strong. Your arteries are the blood vessels that carry blood from your heart throughout your body. A blood pressure reading consists of a higher number over a lower number, such as 110/72. The higher number (systolic) is the pressure inside your arteries when your heart pumps. The lower number (diastolic) is the pressure inside your arteries when your heart relaxes. Ideally you want your blood pressure below 120/80. Hypertension forces your heart to work harder to pump blood. Your arteries may become narrow or stiff. Having untreated or uncontrolled hypertension can cause heart attack, stroke, kidney disease, and other problems. RISK FACTORS Some risk factors for high blood pressure are controllable. Others are not.  Risk factors you cannot control include:   Race. You may be at higher risk if you are African American.  Age. Risk increases with age.  Gender. Men are at higher risk than women before age 58 years. After age 58, women are at higher risk than men. Risk factors you can control include:  Not getting enough exercise or physical activity.  Being overweight.  Getting too much fat, sugar, calories, or salt in your diet.  Drinking too much alcohol. SIGNS AND SYMPTOMS Hypertension does not usually cause signs or symptoms. Extremely high blood pressure (hypertensive crisis) may cause headache, anxiety, shortness of breath, and nosebleed. DIAGNOSIS To check if you have hypertension, your health care provider will measure your blood pressure while you are seated, with your arm held at the level of your heart. It should be measured at least twice using the same arm. Certain conditions can cause a difference in blood pressure between your right and left arms. A blood pressure reading  that is higher than normal on one occasion does not mean that you need treatment. If it is not clear whether you have high blood pressure, you may be asked to return on a different day to have your blood pressure checked again. Or, you may be asked to monitor your blood pressure at home for 1 or more weeks. TREATMENT Treating high blood pressure includes making lifestyle changes and possibly taking medicine. Living a healthy lifestyle can help lower high blood pressure. You may need to change some of your habits. Lifestyle changes may include:  Following the DASH diet. This diet is high in fruits, vegetables, and whole grains. It is low in salt, red meat, and added sugars.  Keep your sodium intake below 2,300 mg per day.  Getting at least 30-45 minutes of aerobic exercise at least 4 times per week.  Losing weight if necessary.  Not smoking.  Limiting alcoholic beverages.  Learning ways to reduce stress. Your health care provider may prescribe medicine if lifestyle changes are not enough to get your blood pressure under control, and if one of the following is true:  You are 3218-58 years of age and your systolic blood pressure is above 140.  You are 58 years of age or older, and your systolic blood pressure is above 150.  Your diastolic blood pressure is above 90.  You have diabetes, and your systolic blood pressure is over 140 or your diastolic blood pressure is over 90.  You have kidney disease and your blood pressure is above 140/90.  You have heart disease and your blood pressure is above 140/90. Your personal target blood pressure may vary  depending on your medical conditions, your age, and other factors. HOME CARE INSTRUCTIONS  Have your blood pressure rechecked as directed by your health care provider.   Take medicines only as directed by your health care provider. Follow the directions carefully. Blood pressure medicines must be taken as prescribed. The medicine does not  work as well when you skip doses. Skipping doses also puts you at risk for problems.  Do not smoke.   Monitor your blood pressure at home as directed by your health care provider. SEEK MEDICAL CARE IF:   You think you are having a reaction to medicines taken.  You have recurrent headaches or feel dizzy.  You have swelling in your ankles.  You have trouble with your vision. SEEK IMMEDIATE MEDICAL CARE IF:  You develop a severe headache or confusion.  You have unusual weakness, numbness, or feel faint.  You have severe chest or abdominal pain.  You vomit repeatedly.  You have trouble breathing. MAKE SURE YOU:   Understand these instructions.  Will watch your condition.  Will get help right away if you are not doing well or get worse.   This information is not intended to replace advice given to you by your health care provider. Make sure you discuss any questions you have with your health care provider.   Document Released: 12/31/2004 Document Revised: 05/17/2014 Document Reviewed: 10/23/2012 Elsevier Interactive Patient Education [employer].

## 2014-11-11 NOTE — Progress Notes (Signed)
    Patient presents to clinic today for BP follow-up.  Endorses taking medications as directed. Patient denies chest pain, palpitations, lightheadedness, dizziness, vision changes or frequent headaches.  BP Readings from Last 3 Encounters:  11/11/14 138/86  05/06/14 110/75  12/24/13 122/83   Past Medical History  Diagnosis Date  . Colon polyps   . Hypertension   . Hyperlipidemia   . Anxiety and depression   . Hepatitis B 1977  . Alcoholism /alcohol abuse [facility](HCC)     Current Outpatient Prescriptions on File Prior to Visit  Medication Sig Dispense Refill  . ALPRAZolam (XANAX) 0.5 MG tablet Take 1 tablet (0.5 mg total) by mouth 2 (two) times daily as needed. for anxiety 60 tablet 0  . cyclobenzaprine (FLEXERIL) 10 MG tablet Take 1 tablet (10 mg total) by mouth 3 (three) times daily as needed for muscle spasms. 30 tablet 0   No current facility-administered medications on file prior to visit.    Allergies  Allergen Reactions  . Sulfa Antibiotics     Family History  Problem Relation Age of Onset  . Aneurysm Mother     Heart  . Hyperlipidemia Mother   . Hypertension Mother   . Epilepsy Brother   . Healthy Brother     x1  . Healthy Son     x2  . Cancer Father [id number]    Stage IV Liver Cancer with mets to Colon    Social History   Social History  . Marital Status: Married    Spouse Name: N/A  . Number of Children: N/A  . Years of Education: N/A   Social History Main Topics  . Smoking status: Current Every Day Smoker -- 1.50 packs/day for 30 years  . Smokeless tobacco: Never Used  . Alcohol Use: 6.0 oz/week    10 Cans of beer per week  . Drug Use: No  . Sexual Activity: No   Other Topics Concern  . None   Social History Narrative   Review of Systems - See HPI.  All other ROS are negative.  BP 138/86 mmHg  Pulse 79  Temp(Src) 97.9 F (36.6 C) (Oral)  Ht 5\' 7"  (1.702 m)  Wt 184 lb 12.8 oz (83.825 kg)  BMI 28.94 kg/m2  SpO2 97%  Physical Exam    Constitutional: He is oriented to person, place, and time and well-developed, well-nourished, and in no distress.  HENT:  Head: Normocephalic and atraumatic.  Eyes: Conjunctivae are normal.  Cardiovascular: Normal rate, regular rhythm, normal heart sounds and intact distal pulses.   Pulmonary/Chest: Effort normal and breath sounds normal. No respiratory distress. He has no wheezes. He has no rales. He exhibits no tenderness.  Neurological: He is alert and oriented to person, place, and time.  Skin: Skin is warm and dry. No rash noted.  Psychiatric: Affect normal.  Vitals reviewed.  No results found for this or any previous visit (from the past 2160 hour(s)).  Assessment/Plan: Hypertension Well-controlled. Asymptomatic. Continue current regimen. Will check every 6 months.

## 2014-11-11 NOTE — Progress Notes (Signed)
Pre visit review using our clinic review tool, if applicable. No additional management support is needed unless otherwise documented below in the visit note. 

## 2014-11-13 NOTE — Assessment & Plan Note (Signed)
Well-controlled. Asymptomatic. Continue current regimen. Will check every 6 months.

## 2014-12-05 ENCOUNTER — Telehealth: Payer: Self-pay | Admitting: Physician Assistant

## 2014-12-05 MED ORDER — ALPRAZOLAM 0.5 MG PO TABS: 0.5000 mg | ORAL_TABLET | Freq: Two times a day (BID) | ORAL | Status: DC | PRN

## 2014-12-05 NOTE — Telephone Encounter (Signed)
Xanax refill sent to pharmacy.  He has 5 refills on his Toprol -- he needs to call them in.

## 2014-12-05 NOTE — Telephone Encounter (Signed)
Patient advised.

## 2014-12-05 NOTE — Telephone Encounter (Signed)
Relation to RU:EAVWpt:self Call back number:[phone] Pharmacy: [facility] [facility] [place] [address] [phone] (Phone) [phone] (Fax)         Reason for call:  Patient requesting ALPRAZolam (XANAX) 0.5 MG tablet and metoprolol succinate (TOPROL-XL) 25 MG 24 hr tablet

## 2015-01-17 MED FILL — ALPRAZolam 0.5 MG TABS: 0.5 | 30 days supply | Qty: 60 | Fill #1

## 2015-02-10 MED FILL — METOPROLOL SUCC ER 25 MG TA: 25 | 30 days supply | Qty: 30 | Fill #3

## 2015-03-13 MED FILL — ALPRAZolam 0.5 MG TABS: 0.5 | 30 days supply | Qty: 60 | Fill #2

## 2015-03-13 MED FILL — METOPROLOL SUCC ER 25 MG TA: 25 | 30 days supply | Qty: 30 | Fill #4

## 2015-03-30 ENCOUNTER — Other Ambulatory Visit: Payer: Self-pay | Admitting: Physician Assistant

## 2015-03-30 DIAGNOSIS — M62838 Other muscle spasm: Secondary | ICD-10-CM

## 2015-03-30 MED ORDER — CYCLOBENZAPRINE HCL 10 MG PO TABS: 10.0000 mg | ORAL_TABLET | Freq: Three times a day (TID) | ORAL | Status: DC | PRN

## 2015-03-30 NOTE — Telephone Encounter (Signed)
Relation to ZO:XWRUpt:self Call back number:[phone] Pharmacy: [facility] [facility] - [place] - [address] [phone] (Phone) [phone] (Fax)         Reason for call:  Patient requesting a refill cyclobenzaprine (FLEXERIL) 10 MG tablet due to back and neck pain.

## 2015-03-31 MED FILL — CYCLOBENZAPRINE 10 MG TAB: 10 | 10 days supply | Qty: 30 | Fill #0

## 2015-03-31 NOTE — Telephone Encounter (Signed)
Called and spoke with the pt and informed him that the prescription he requested has been approved and sent to the pharmacy.  Also informed the pt that if he continues to have the pain he will need to call and schedule an appt.  Pt verbalized understanding and agreed.//AB/CMA

## 2015-04-06 ENCOUNTER — Ambulatory Visit (INDEPENDENT_AMBULATORY_CARE_PROVIDER_SITE_OTHER): Payer: Managed Care, Other (non HMO) | Admitting: Family Medicine

## 2015-04-06 ENCOUNTER — Encounter: Payer: Self-pay | Admitting: Family Medicine

## 2015-04-06 VITALS — BP 144/92 | HR 76 | Temp 98.4°F | Ht 67.0 in | Wt 186.4 lb

## 2015-04-06 DIAGNOSIS — G8929 Other chronic pain: Secondary | ICD-10-CM

## 2015-04-06 DIAGNOSIS — F411 Generalized anxiety disorder: Secondary | ICD-10-CM

## 2015-04-06 DIAGNOSIS — J208 Acute bronchitis due to other specified organisms: Secondary | ICD-10-CM | POA: Diagnosis not present

## 2015-04-06 DIAGNOSIS — M542 Cervicalgia: Secondary | ICD-10-CM | POA: Diagnosis not present

## 2015-04-06 MED ORDER — AZITHROMYCIN 250 MG PO TABS: ORAL_TABLET | ORAL | Status: DC

## 2015-04-06 NOTE — Patient Instructions (Signed)
Rest, drink plenty of fluids and use an OTC probiotic to help prevent diarrhea We are going to use azithromycin to treat bronchitis and possible strep- use this antibiotic as directed Continue OTC medications as needed for your symptoms Let me know if you are not feeling better in the next few days

## 2015-04-06 NOTE — Progress Notes (Signed)
Pre visit review using our clinic review tool, if applicable. No additional management support is needed unless otherwise documented below in the visit note. 

## 2015-04-06 NOTE — Progress Notes (Signed)
[facility] [address] [place], [place] [address] [phone]  Date:  04/06/2015   Name:  Andrew Pollard   DOB:  08/06/1955   MRN:  [id number]  PCP:  Piedad Climes, PA-C    Chief Complaint: Cough   History of Present Illness:  Andrew Pollard is a 60 y.o. very pleasant male patient who presents with the following:  History of HTN, anxiety, long term heavy smoking.  Here today with possible strep throat He has been exposed to several family members with strep.   Last night he had a cough and did not sleep well He does tend to have a cough in the morning- this is not new for him He has PND all year He does not have a ST but just feels tired  He has noted chills but no fevers He notes body aches  No GI symptoms   He has been sick for 4 days now.    The xanax is working well for him in controlling his anxiety He takes 1/2 or 1 per day.  He also got a new brand of insurance and might want to try again to get an MRI of his neck which had been denied in the past   Patient Active Problem List   Diagnosis Date Noted  . Dental infection 05/06/2014  . Plantar warts 05/06/2014  . Cervical neck pain with evidence of disc disease 12/24/2013  . Acute maxillary sinusitis 12/24/2013  . Foot joint pain 07/09/2013  . Neck pain 03/31/2013  . Muscle spasm 01/24/2013  . Hypertension 01/24/2013  . Anxiety and depression 01/24/2013  . Visit for preventive health examination 01/24/2013    Past Medical History  Diagnosis Date  . Colon polyps   . Hypertension   . Hyperlipidemia   . Anxiety and depression   . Hepatitis B 1977  . Alcoholism /alcohol abuse [facility])     Past Surgical History  Procedure Laterality Date  . Appendectomy  1970  . Liver biopsy  1977    Hepatitis B  . Wisdom tooth extraction      Social History  Substance Use Topics  . Smoking status: Current Every Day Smoker -- 1.50 packs/day  for 30 years  . Smokeless tobacco: Never Used  . Alcohol Use: 6.0 oz/week    10 Cans of beer per week    Family History  Problem Relation Age of Onset  . Aneurysm Mother     Heart  . Hyperlipidemia Mother   . Hypertension Mother   . Epilepsy Brother   . Healthy Brother     x1  . Healthy Son     x2  . Cancer Father 21    Stage IV Liver Cancer with mets to Colon    Allergies  Allergen Reactions  . Sulfa Antibiotics     Medication list has been reviewed and updated.  Current Outpatient Prescriptions on File Prior to Visit  Medication Sig Dispense Refill  . ALPRAZolam (XANAX) 0.5 MG tablet Take 1 tablet (0.5 mg total) by mouth 2 (two) times daily as needed. for anxiety 60 tablet 2  . cyclobenzaprine (FLEXERIL) 10 MG tablet Take 1 tablet (10 mg total) by mouth 3 (three) times daily as needed for muscle spasms. 30 tablet 0  . metoprolol succinate (TOPROL-XL) 25 MG 24 hr tablet Take 1 tablet (25 mg total) by mouth daily. 30 tablet 5   No current facility-administered medications on  file prior to visit.    Review of Systems:  As per HPI- otherwise negative.   Physical Examination: Filed Vitals:   04/06/15 1747  BP: 144/92  Pulse: 76  Temp: 98.4 F (36.9 C)   Filed Vitals:   04/06/15 1747  Height: 5\' 7"  (1.702 m)  Weight: 186 lb 6.4 oz (84.55 kg)   Body mass index is 29.19 kg/(m^2). Ideal Body Weight: Weight in (lb) to have BMI = 25: 159.3  GEN: WDWN, NAD, Non-toxic, A & O x 3, overweight, tobacco use HEENT: Atraumatic, Normocephalic. Neck supple. No masses, No LAD.  ;Bilateral TM wnl, oropharynx normal.  PEERL,EOMI.   Ears and Nose: No external deformity. CV: RRR, No M/G/R. No JVD. No thrill. No extra heart sounds. PULM: he has mild ronchi bilaterally.  No retractions. No resp. distress. No accessory muscle use. ABD: S, NT, ND, +BS. No rebound. No HSM. EXTR: No c/c/e NEURO Normal gait.  PSYCH: Normally interactive. Conversant. Not depressed or anxious  appearing.  Calm demeanor.    Assessment and Plan: Acute bronchitis due to other specified organisms - Plan: azithromycin (ZITHROMAX) 250 MG tablet, DISCONTINUED: azithromycin (ZITHROMAX) 250 MG tablet  GAD (generalized anxiety disorder)  Chronic neck pain  Counseled that his sx and exam are not consistent with strep, but that treatment for bronchitis would also treat strep just in case.  OTC medications as needed, let me know if not better in the next few days His GAD is well managed with his current regimen of xanax He may want to try again and get an MRI for his neck pain- will pass along to his PCP Signed Abbe AmsterdamJessica Copland, MD

## 2015-04-07 ENCOUNTER — Ambulatory Visit: Payer: 59 | Admitting: Physician Assistant

## 2015-04-07 MED FILL — AZITHROMYCIN 250 MG TABLET: 250 | 5 days supply | Qty: 6 | Fill #0

## 2015-04-10 ENCOUNTER — Telehealth: Payer: Self-pay | Admitting: Physician Assistant

## 2015-04-10 NOTE — Telephone Encounter (Signed)
-----   Message from Pearline CablesJessica C Copland, MD sent at 04/06/2015  9:20 PM EDT ----- HI Selena Battenody- I saw this pt today for illness.  He wanted to know if it could count as his anxiety/ xanax check.  I told him that was up to your discretion but we did go over his xanax use briefly.  Also, he has a new insurance carrier and wonders if you wanted to revisit the idea of doing an MRI of his neck (which was denied before) Thanks!  JC

## 2015-04-10 NOTE — Telephone Encounter (Signed)
Please call patient.  (1) I am ok with his visit with Dr. Patsy Lageropland counting for anxiety follow-up as she did address with him. (2) I would agree with her that we should attempt to get MRI of neck with his new insurance if symptoms are persisting. He may need follow-up with me to reassess in detail so that insurance will pay, as his visit with Dr. Patsy Lageropland was for other medical issues and the pain was not reviewed in detail.

## 2015-04-11 MED ORDER — METOPROLOL SUCCINATE ER 25 MG PO TB24: 25.0000 mg | ORAL_TABLET | Freq: Every day | ORAL | Status: DC

## 2015-04-11 MED FILL — METOPROLOL SUCC ER 25 MG TA: 25 | 30 days supply | Qty: 30 | Fill #0

## 2015-04-11 NOTE — Telephone Encounter (Signed)
Called and spoke with the pt and informed him of the note below.  Pt verbalized understanding.  Pt stated that it's hard to come in because he is working 6 days a week and it's hard to miss work.  While talking to the pt he asked to have his blood pressure medication refilled.  Informed the pt that [facility], which was a blood pressure check, he was asked to schedule an appt in 6 months for a CPE with fasting labs.  Informed the pt that 6 months would be in April.  Pt stated that he will go ahead and schedule the appt for the CPE.  Informed the pt that if he schedules for a CPE and they discuss his neck pain for the MRI he may be billed for the CPE and the neck pain.  Pt stated that he has had this neck pain for along time and waiting a little longer will be okay.  Pt stated that he will go ahead and schedule for the CPE.  Pt was schedule for CPE with fasting labs on(Mon-05/15/15 @ 7:30am).  Rx refilled and sent to the pharmacy by e-script.//AB/CMA

## 2015-05-15 ENCOUNTER — Ambulatory Visit (INDEPENDENT_AMBULATORY_CARE_PROVIDER_SITE_OTHER): Payer: Managed Care, Other (non HMO) | Admitting: Physician Assistant

## 2015-05-15 ENCOUNTER — Encounter: Payer: Self-pay | Admitting: Physician Assistant

## 2015-05-15 VITALS — BP 132/88 | HR 70 | Temp 98.0°F | Resp 16 | Ht 67.0 in | Wt 183.2 lb

## 2015-05-15 DIAGNOSIS — Z114 Encounter for screening for human immunodeficiency virus [HIV]: Secondary | ICD-10-CM | POA: Insufficient documentation

## 2015-05-15 DIAGNOSIS — Z125 Encounter for screening for malignant neoplasm of prostate: Secondary | ICD-10-CM | POA: Insufficient documentation

## 2015-05-15 DIAGNOSIS — F329 Major depressive disorder, single episode, unspecified: Secondary | ICD-10-CM

## 2015-05-15 DIAGNOSIS — Z6828 Body mass index (BMI) 28.0-28.9, adult: Secondary | ICD-10-CM | POA: Diagnosis not present

## 2015-05-15 DIAGNOSIS — I10 Essential (primary) hypertension: Secondary | ICD-10-CM | POA: Diagnosis not present

## 2015-05-15 DIAGNOSIS — F418 Other specified anxiety disorders: Secondary | ICD-10-CM

## 2015-05-15 DIAGNOSIS — Z0000 Encounter for general adult medical examination without abnormal findings: Secondary | ICD-10-CM

## 2015-05-15 DIAGNOSIS — F32A Depression, unspecified: Secondary | ICD-10-CM

## 2015-05-15 DIAGNOSIS — F419 Anxiety disorder, unspecified: Secondary | ICD-10-CM

## 2015-05-15 LAB — COMPREHENSIVE METABOLIC PANEL
ALBUMIN: 4.5 g/dL (ref 3.5–5.2)
ALK PHOS: 56 U/L (ref 39–117)
ALT: 17 U/L (ref 0–53)
AST: 17 U/L (ref 0–37)
BILIRUBIN TOTAL: 0.6 mg/dL (ref 0.2–1.2)
BUN: 9 mg/dL (ref 6–23)
CO2: 29 mEq/L (ref 19–32)
Calcium: 9.7 mg/dL (ref 8.4–10.5)
Chloride: 105 mEq/L (ref 96–112)
Creatinine, Ser: 0.91 mg/dL (ref 0.40–1.50)
GFR: 90.81 mL/min (ref >60.00)
GLUCOSE: 93 mg/dL (ref 70–99)
POTASSIUM: 4.5 meq/L (ref 3.5–5.1)
SODIUM: 140 meq/L (ref 135–145)
TOTAL PROTEIN: 7 g/dL (ref 6.0–8.3)

## 2015-05-15 LAB — CBC
HCT: 48.3 % (ref 39.0–52.0)
Hemoglobin: 16.5 g/dL (ref 13.0–17.0)
MCHC: 34.2 g/dL (ref 30.0–36.0)
MCV: 86.6 fl (ref 78.0–100.0)
Platelets: 238 10*3/uL (ref 150.0–400.0)
RBC: 5.58 Mil/uL (ref 4.22–5.81)
RDW: 13.3 % (ref 11.5–15.5)
WBC: 5.3 10*3/uL (ref 4.0–10.5)

## 2015-05-15 LAB — URINALYSIS, ROUTINE W REFLEX MICROSCOPIC
Bilirubin Urine: NEGATIVE
Hgb urine dipstick: NEGATIVE
KETONES UR: NEGATIVE
Leukocytes, UA: NEGATIVE
Nitrite: NEGATIVE
TOTAL PROTEIN, URINE-UPE24: NEGATIVE
URINE GLUCOSE: NEGATIVE
Urobilinogen, UA: 0.2 (ref 0.0–1.0)
pH: 5.5 (ref 5.0–8.0)

## 2015-05-15 LAB — LIPID PANEL
Cholesterol: 136 mg/dL (ref 0–200)
HDL: 35.8 mg/dL — ABNORMAL LOW (ref >39.00)
LDL Cholesterol: 73 mg/dL (ref 0–99)
NONHDL: 100.66
Total CHOL/HDL Ratio: 4
Triglycerides: 137 mg/dL (ref 0.0–149.0)
VLDL: 27.4 mg/dL (ref 0.0–40.0)

## 2015-05-15 LAB — HEMOGLOBIN A1C: HEMOGLOBIN A1C: 5.9 % (ref 4.6–6.5)

## 2015-05-15 LAB — TSH: TSH: 1.37 u[IU]/mL (ref 0.35–4.50)

## 2015-05-15 LAB — PSA: PSA: 2.33 ng/mL (ref 0.10–4.00)

## 2015-05-15 MED ORDER — METOPROLOL SUCCINATE ER 25 MG PO TB24: 25.0000 mg | ORAL_TABLET | Freq: Every day | ORAL | Status: DC

## 2015-05-15 MED ORDER — ALPRAZOLAM 0.5 MG PO TABS: 0.5000 mg | ORAL_TABLET | Freq: Two times a day (BID) | ORAL | Status: DC | PRN

## 2015-05-15 MED FILL — METOPROLOL SUCC ER 25 MG TA: 25 | 30 days supply | Qty: 30 | Fill #0

## 2015-05-15 MED FILL — ALPRAZolam 0.5 MG TABS: 0.5 | 30 days supply | Qty: 60 | Fill #0

## 2015-05-15 NOTE — Progress Notes (Signed)
Patient presents to clinic today for annual exam.  Patient is fasting for labs.  Chronic Issues: Hypertension -- Endorses taking medications as directed. Patient denies chest pain, palpitations, lightheadedness, dizziness, vision changes or frequent headaches.  BP Readings from Last 3 Encounters:  05/15/15 132/88  04/06/15 144/92  11/11/14 138/86   Hyperlipidemia -- Previously controlled with diet and exercise. Is due for recheck.  Anxiety and Depression -- Endorses doing very well overall. Is taking Xanax around once per day.   Health Maintenance: Immunizations -- up-to-date Colonoscopy -- up-to-date HIV screening -- Patient has never had. Agrees to today. Hep C Screening -- Patient will check with insurance to see coverage. Prostate Cancer Screening -- Brother with Prostate cancer (10 years older than patient)  Past Medical History  Diagnosis Date  . Colon polyps   . Hypertension   . Hyperlipidemia   . Anxiety and depression   . Hepatitis B 1977  . Alcoholism /alcohol abuse [facility])     Past Surgical History  Procedure Laterality Date  . Appendectomy  1970  . Liver biopsy  1977    Hepatitis B  . Wisdom tooth extraction      Current Outpatient Prescriptions on File Prior to Visit  Medication Sig Dispense Refill  . cyclobenzaprine (FLEXERIL) 10 MG tablet Take 1 tablet (10 mg total) by mouth 3 (three) times daily as needed for muscle spasms. 30 tablet 0   No current facility-administered medications on file prior to visit.    Allergies  Allergen Reactions  . Sulfa Antibiotics     Family History  Problem Relation Age of Onset  . Aneurysm Mother     Heart  . Hyperlipidemia Mother   . Hypertension Mother   . Epilepsy Brother   . Healthy Brother     x1  . Healthy Son     x2  . Cancer Father 62    Stage IV Liver Cancer with mets to Colon    Social History   Social History  . Marital Status: Married    Spouse Name: N/A  . Number of Children: N/A  .  Years of Education: N/A   Occupational History  . Not on file.   Social History Main Topics  . Smoking status: Current Every Day Smoker -- 1.50 packs/day for 30 years  . Smokeless tobacco: Never Used  . Alcohol Use: 6.0 oz/week    10 Cans of beer per week  . Drug Use: No  . Sexual Activity: No   Other Topics Concern  . Not on file   Social History Narrative   Review of Systems  Constitutional: Negative for fever and weight loss.  HENT: Negative for ear discharge, ear pain, hearing loss and tinnitus.   Eyes: Negative for blurred vision, double vision, photophobia and pain.  Respiratory: Negative for cough and shortness of breath.   Cardiovascular: Negative for chest pain and palpitations.  Gastrointestinal: Negative for heartburn, nausea, vomiting, abdominal pain, diarrhea, constipation, blood in stool and melena.  Genitourinary: Negative for dysuria, urgency, frequency, hematuria and flank pain.       Nocturia x 0.  Musculoskeletal: Negative for falls.  Neurological: Negative for dizziness, loss of consciousness and headaches.  Endo/Heme/Allergies: Negative for environmental allergies.  Psychiatric/Behavioral: Negative for depression, suicidal ideas, hallucinations and substance abuse. The patient is not nervous/anxious and does not have insomnia.     BP 132/88 mmHg  Pulse 70  Temp(Src) 98 F (36.7 C) (Oral)  Resp 16  Ht 5'  7" (1.702 m)  Wt 183 lb 4 oz (83.122 kg)  BMI 28.69 kg/m2  SpO2 99%  Physical Exam  Constitutional: He is oriented to person, place, and time and well-developed, well-nourished, and in no distress.  HENT:  Head: Normocephalic and atraumatic.  Right Ear: External ear normal.  Left Ear: External ear normal.  Nose: Nose normal.  Mouth/Throat: Oropharynx is clear and moist. No oropharyngeal exudate.  Eyes: Conjunctivae and EOM are normal. Pupils are equal, round, and reactive to light.  Neck: Neck supple. No thyromegaly present.  Cardiovascular:  Normal rate, regular rhythm, normal heart sounds and intact distal pulses.   Pulmonary/Chest: Effort normal and breath sounds normal. No respiratory distress. He has no wheezes. He has no rales. He exhibits no tenderness.  Abdominal: Soft. Bowel sounds are normal. He exhibits no distension and no mass. There is no tenderness. There is no rebound and no guarding.  Genitourinary: Testes/scrotum normal.  Lymphadenopathy:    He has no cervical adenopathy.  Neurological: He is alert and oriented to person, place, and time.  Skin: Skin is warm and dry. No rash noted.  Psychiatric: Affect normal.  Vitals reviewed.  No results found for this or any previous visit (from the past 2160 hour(s)).  Assessment/Plan: Visit for preventive health examination Depression screen negative. Health Maintenance reviewed -- HIV screen today. Patient to check on insurance coverage for Hep C screen. Preventive schedule discussed and handout given in AVS. Will obtain fasting labs today.   Screening for HIV without presence of risk factors Order HIV antibody placed.   Prostate cancer screening The natural history of prostate cancer and ongoing controversy regarding screening and potential treatment outcomes of prostate cancer has been discussed with the patient. The meaning of a false positive PSA and a false negative PSA has been discussed. He indicates understanding of the limitations of this screening test and wishes to proceed with screening PSA testing. Patient is asymptomatic and declines DRE.   Hypertension Well-controlled. Continue current regimen. Medications refilled. Will check labs today.  Anxiety and depression Doing well overall. Continue current regimen.

## 2015-05-15 NOTE — Assessment & Plan Note (Signed)
Doing well overall. Continue current regimen.

## 2015-05-15 NOTE — Assessment & Plan Note (Signed)
Well-controlled. Continue current regimen.  Medications refilled. Will check labs today. 

## 2015-05-15 NOTE — Patient Instructions (Signed)
Please go to the lab for blood work.   Our office will call you with your results unless you have chosen to receive results via MyChart.  If your blood work is normal we will follow-up each year for physicals and as scheduled for chronic medical problems.  If anything is abnormal we will treat accordingly and get you in for a follow-up.  Please continue chronic medications as directed.  Smoking Cessation, Tips for Success If you are ready to quit smoking, congratulations! You have chosen to help yourself be healthier. Cigarettes bring nicotine, tar, carbon monoxide, and other irritants into your body. Your lungs, heart, and blood vessels will be able to work better without these poisons. There are many different ways to quit smoking. Nicotine gum, nicotine patches, a nicotine inhaler, or nicotine nasal spray can help with physical craving. Hypnosis, support groups, and medicines help break the habit of smoking. WHAT THINGS CAN I DO TO MAKE QUITTING EASIER?  Here are some tips to help you quit for good:  Pick a date when you will quit smoking completely. Tell all of your friends and family about your plan to quit on that date.  Do not try to slowly cut down on the number of cigarettes you are smoking. Pick a quit date and quit smoking completely starting on that day.  Throw away all cigarettes.   Clean and remove all ashtrays from your home, work, and car.  On a card, write down your reasons for quitting. Carry the card with you and read it when you get the urge to smoke.  Cleanse your body of nicotine. Drink enough water and fluids to keep your urine clear or pale yellow. Do this after quitting to flush the nicotine from your body.  Learn to predict your moods. Do not let a bad situation be your excuse to have a cigarette. Some situations in your life might tempt you into wanting a cigarette.  Never have "just one" cigarette. It leads to wanting another and another. Remind yourself of  your decision to quit.  Change habits associated with smoking. If you smoked while driving or when feeling stressed, try other activities to replace smoking. Stand up when drinking your coffee. Brush your teeth after eating. Sit in a different chair when you read the paper. Avoid alcohol while trying to quit, and try to drink fewer caffeinated beverages. Alcohol and caffeine may urge you to smoke.  Avoid foods and drinks that can trigger a desire to smoke, such as sugary or spicy foods and alcohol.  Ask people who smoke not to smoke around you.  Have something planned to do right after eating or having a cup of coffee. For example, plan to take a walk or exercise.  Try a relaxation exercise to calm you down and decrease your stress. Remember, you may be tense and nervous for the first 2 weeks after you quit, but this will pass.  Find new activities to keep your hands busy. Play with a pen, coin, or rubber band. Doodle or draw things on paper.  Brush your teeth right after eating. This will help cut down on the craving for the taste of tobacco after meals. You can also try mouthwash.   Use oral substitutes in place of cigarettes. Try using lemon drops, carrots, cinnamon sticks, or chewing gum. Keep them handy so they are available when you have the urge to smoke.  When you have the urge to smoke, try deep breathing.  Designate your home  as a nonsmoking area.  If you are a heavy smoker, ask your health care provider about a prescription for nicotine chewing gum. It can ease your withdrawal from nicotine.  Reward yourself. Set aside the cigarette money you save and buy yourself something nice.  Look for support from others. Join a support group or smoking cessation program. Ask someone at home or at work to help you with your plan to quit smoking.  Always ask yourself, "Do I need this cigarette or is this just a reflex?" Tell yourself, "Today, I choose not to smoke," or "I do not want to  smoke." You are reminding yourself of your decision to quit.  Do not replace cigarette smoking with electronic cigarettes (commonly called e-cigarettes). The safety of e-cigarettes is unknown, and some may contain harmful chemicals.  If you relapse, do not give up! Plan ahead and think about what you will do the next time you get the urge to smoke. HOW WILL I FEEL WHEN I QUIT SMOKING? You may have symptoms of withdrawal because your body is used to nicotine (the addictive substance in cigarettes). You may crave cigarettes, be irritable, feel very hungry, cough often, get headaches, or have difficulty concentrating. The withdrawal symptoms are only temporary. They are strongest when you first quit but will go away within 10-14 days. When withdrawal symptoms occur, stay in control. Think about your reasons for quitting. Remind yourself that these are signs that your body is healing and getting used to being without cigarettes. Remember that withdrawal symptoms are easier to treat than the major diseases that smoking can cause.  Even after the withdrawal is over, expect periodic urges to smoke. However, these cravings are generally short lived and will go away whether you smoke or not. Do not smoke! WHAT RESOURCES ARE AVAILABLE TO HELP ME QUIT SMOKING? Your health care provider can direct you to community resources or hospitals for support, which may include:  Group support.  Education.  Hypnosis.  Therapy.   This information is not intended to replace advice given to you by your health care provider. Make sure you discuss any questions you have with your health care provider.   Document Released: 09/29/2003 Document Revised: 01/21/2014 Document Reviewed: 06/18/2012 Elsevier Interactive Patient Education 2016 [place] for Adults, Male A healthy lifestyle and preventive care can promote health and wellness. Preventive health guidelines for men include the following key  practices:  A routine yearly physical is a good way to check with your health care provider about your health and preventative screening. It is a chance to share any concerns and updates on your health and to receive a thorough exam.  Visit your dentist for a routine exam and preventative care every 6 months. Brush your teeth twice a day and floss once a day. Good oral hygiene prevents tooth decay and gum disease.  The frequency of eye exams is based on your age, health, family medical history, use of contact lenses, and other factors. Follow your health care provider's recommendations for frequency of eye exams.  Eat a healthy diet. Foods such as vegetables, fruits, whole grains, low-fat dairy products, and lean protein foods contain the nutrients you need without too many calories. Decrease your intake of foods high in solid fats, added sugars, and salt. Eat the right amount of calories for you.Get information about a proper diet from your health care provider, if necessary.  Regular physical exercise is one of the most important things you can  do for your health. Most adults should get at least 150 minutes of moderate-intensity exercise (any activity that increases your heart rate and causes you to sweat) each week. In addition, most adults need muscle-strengthening exercises on 2 or more days a week.  Maintain a healthy weight. The body mass index (BMI) is a screening tool to identify possible weight problems. It provides an estimate of body fat based on height and weight. Your health care provider can find your BMI and can help you achieve or maintain a healthy weight.For adults 20 years and older:  A BMI below 18.5 is considered underweight.  A BMI of 18.5 to 24.9 is normal.  A BMI of 25 to 29.9 is considered overweight.  A BMI of 30 and above is considered obese.  Maintain normal blood lipids and cholesterol levels by exercising and minimizing your intake of saturated fat. Eat a  balanced diet with plenty of fruit and vegetables. Blood tests for lipids and cholesterol should begin at age 7 and be repeated every 5 years. If your lipid or cholesterol levels are high, you are over 50, or you are at high risk for heart disease, you may need your cholesterol levels checked more frequently.Ongoing high lipid and cholesterol levels should be treated with medicines if diet and exercise are not working.  If you smoke, find out from your health care provider how to quit. If you do not use tobacco, do not start.  Lung cancer screening is recommended for adults aged 76-80 years who are at high risk for developing lung cancer because of a history of smoking. A yearly low-dose CT scan of the lungs is recommended for people who have at least a 30-pack-year history of smoking and are a current smoker or have quit within the past 15 years. A pack year of smoking is smoking an average of 1 pack of cigarettes a day for 1 year (for example: 1 pack a day for 30 years or 2 packs a day for 15 years). Yearly screening should continue until the smoker has stopped smoking for at least 15 years. Yearly screening should be stopped for people who develop a health problem that would prevent them from having lung cancer treatment.  If you choose to drink alcohol, do not have more than 2 drinks per day. One drink is considered to be 12 ounces (355 mL) of beer, 5 ounces (148 mL) of wine, or 1.5 ounces (44 mL) of liquor.  Avoid use of street drugs. Do not share needles with anyone. Ask for help if you need support or instructions about stopping the use of drugs.  High blood pressure causes heart disease and increases the risk of stroke. Your blood pressure should be checked at least every 1-2 years. Ongoing high blood pressure should be treated with medicines, if weight loss and exercise are not effective.  If you are 72-52 years old, ask your health care provider if you should take aspirin to prevent heart  disease.  Diabetes screening is done by taking a blood sample to check your blood glucose level after you have not eaten for a certain period of time (fasting). If you are not overweight and you do not have risk factors for diabetes, you should be screened once every 3 years starting at age 86. If you are overweight or obese and you are 87-69 years of age, you should be screened for diabetes every year as part of your cardiovascular risk assessment.  Colorectal cancer can be detected  and often prevented. Most routine colorectal cancer screening begins at the age of 68 and continues through age 34. However, your health care provider may recommend screening at an earlier age if you have risk factors for colon cancer. On a yearly basis, your health care provider may provide home test kits to check for hidden blood in the stool. Use of a small camera at the end of a tube to directly examine the colon (sigmoidoscopy or colonoscopy) can detect the earliest forms of colorectal cancer. Talk to your health care provider about this at age 65, when routine screening begins. Direct exam of the colon should be repeated every 5-10 years through age 33, unless early forms of precancerous polyps or small growths are found.  People who are at an increased risk for hepatitis B should be screened for this virus. You are considered at high risk for hepatitis B if:  You were born in a country where hepatitis B occurs often. Talk with your health care provider about which countries are considered high risk.  Your parents were born in a high-risk country and you have not received a shot to protect against hepatitis B (hepatitis B vaccine).  You have HIV or AIDS.  You use needles to inject street drugs.  You live with, or have sex with, someone who has hepatitis B.  You are a man who has sex with other men (MSM).  You get hemodialysis treatment.  You take certain medicines for conditions such as cancer, organ  transplantation, and autoimmune conditions.  Hepatitis C blood testing is recommended for all people born from [birth year] through 1965 and any individual with known risks for hepatitis C.  Practice safe sex. Use condoms and avoid high-risk sexual practices to reduce the spread of sexually transmitted infections (STIs). STIs include gonorrhea, chlamydia, syphilis, trichomonas, herpes, HPV, and human immunodeficiency virus (HIV). Herpes, HIV, and HPV are viral illnesses that have no cure. They can result in disability, cancer, and death.  If you are a man who has sex with other men, you should be screened at least once per year for:  HIV.  Urethral, rectal, and pharyngeal infection of gonorrhea, chlamydia, or both.  If you are at risk of being infected with HIV, it is recommended that you take a prescription medicine daily to prevent HIV infection. This is called preexposure prophylaxis (PrEP). You are considered at risk if:  You are a man who has sex with other men (MSM) and have other risk factors.  You are a heterosexual man, are sexually active, and are at increased risk for HIV infection.  You take drugs by injection.  You are sexually active with a partner who has HIV.  Talk with your health care provider about whether you are at high risk of being infected with HIV. If you choose to begin PrEP, you should first be tested for HIV. You should then be tested every 3 months for as long as you are taking PrEP.  A one-time screening for abdominal aortic aneurysm (AAA) and surgical repair of large AAAs by ultrasound are recommended for men ages 28 to 64 years who are current or former smokers.  Healthy men should no longer receive prostate-specific antigen (PSA) blood tests as part of routine cancer screening. Talk with your health care provider about prostate cancer screening.  Testicular cancer screening is not recommended for adult males who have no symptoms. Screening includes self-exam, a  health care provider exam, and other screening tests. Consult with your  health care provider about any symptoms you have or any concerns you have about testicular cancer.  Use sunscreen. Apply sunscreen liberally and repeatedly throughout the day. You should seek shade when your shadow is shorter than you. Protect yourself by wearing long sleeves, pants, a wide-brimmed hat, and sunglasses year round, whenever you are outdoors.  Once a month, do a whole-body skin exam, using a mirror to look at the skin on your back. Tell your health care provider about new moles, moles that have irregular borders, moles that are larger than a pencil eraser, or moles that have changed in shape or color.  Stay current with required vaccines (immunizations).  Influenza vaccine. All adults should be immunized every year.  Tetanus, diphtheria, and acellular pertussis (Td, Tdap) vaccine. An adult who has not previously received Tdap or who does not know his vaccine status should receive 1 dose of Tdap. This initial dose should be followed by tetanus and diphtheria toxoids (Td) booster doses every 10 years. Adults with an unknown or incomplete history of completing a 3-dose immunization series with Td-containing vaccines should begin or complete a primary immunization series including a Tdap dose. Adults should receive a Td booster every 10 years.  Varicella vaccine. An adult without evidence of immunity to varicella should receive 2 doses or a second dose if he has previously received 1 dose.  Human papillomavirus (HPV) vaccine. Males aged 11-21 years who have not received the vaccine previously should receive the 3-dose series. Males aged 22-26 years may be immunized. Immunization is recommended through the age of 82 years for any male who has sex with males and did not get any or all doses earlier. Immunization is recommended for any person with an immunocompromised condition through the age of 95 years if he did not get  any or all doses earlier. During the 3-dose series, the second dose should be obtained 4-8 weeks after the first dose. The third dose should be obtained 24 weeks after the first dose and 16 weeks after the second dose.  Zoster vaccine. One dose is recommended for adults aged 14 years or older unless certain conditions are present.  Measles, mumps, and rubella (MMR) vaccine. Adults born before [birth year] generally are considered immune to measles and mumps. Adults born in [birth year] or later should have 1 or more doses of MMR vaccine unless there is a contraindication to the vaccine or there is laboratory evidence of immunity to each of the three diseases. A routine second dose of MMR vaccine should be obtained at least 28 days after the first dose for students attending postsecondary schools, health care workers, or international travelers. People who received inactivated measles vaccine or an unknown type of measles vaccine during 1963-1967 should receive 2 doses of MMR vaccine. People who received inactivated mumps vaccine or an unknown type of mumps vaccine before 1979 and are at high risk for mumps infection should consider immunization with 2 doses of MMR vaccine. Unvaccinated health care workers born before [birth year] who lack laboratory evidence of measles, mumps, or rubella immunity or laboratory confirmation of disease should consider measles and mumps immunization with 2 doses of MMR vaccine or rubella immunization with 1 dose of MMR vaccine.  Pneumococcal 13-valent conjugate (PCV13) vaccine. When indicated, a person who is uncertain of his immunization history and has no record of immunization should receive the PCV13 vaccine. All adults 44 years of age and older should receive this vaccine. An adult aged 50 years or older who has certain  medical conditions and has not been previously immunized should receive 1 dose of PCV13 vaccine. This PCV13 should be followed with a dose of pneumococcal polysaccharide (PPSV23)  vaccine. Adults who are at high risk for pneumococcal disease should obtain the PPSV23 vaccine at least 8 weeks after the dose of PCV13 vaccine. Adults older than 59 years of age who have normal immune system function should obtain the PPSV23 vaccine dose at least 1 year after the dose of PCV13 vaccine.  Pneumococcal polysaccharide (PPSV23) vaccine. When PCV13 is also indicated, PCV13 should be obtained first. All adults aged 53 years and older should be immunized. An adult younger than age 60 years who has certain medical conditions should be immunized. Any person who resides in a nursing home or long-term care facility should be immunized. An adult smoker should be immunized. People with an immunocompromised condition and certain other conditions should receive both PCV13 and PPSV23 vaccines. People with human immunodeficiency virus (HIV) infection should be immunized as soon as possible after diagnosis. Immunization during chemotherapy or radiation therapy should be avoided. Routine use of PPSV23 vaccine is not recommended for American Indians, [place] Natives, or people younger than 65 years unless there are medical conditions that require PPSV23 vaccine. When indicated, people who have unknown immunization and have no record of immunization should receive PPSV23 vaccine. One-time revaccination 5 years after the first dose of PPSV23 is recommended for people aged 19-64 years who have chronic kidney failure, nephrotic syndrome, asplenia, or immunocompromised conditions. People who received 1-2 doses of PPSV23 before age 18 years should receive another dose of PPSV23 vaccine at age 45 years or later if at least 5 years have passed since the previous dose. Doses of PPSV23 are not needed for people immunized with PPSV23 at or after age 89 years.  Meningococcal vaccine. Adults with asplenia or persistent complement component deficiencies should receive 2 doses of quadrivalent meningococcal conjugate (MenACWY-D)  vaccine. The doses should be obtained at least 2 months apart. Microbiologists working with certain meningococcal bacteria, [employer] recruits, people at risk during an outbreak, and people who travel to or live in countries with a high rate of meningitis should be immunized. A first-year college student up through age 57 years who is living in a residence hall should receive a dose if he did not receive a dose on or after his 16th birthday. Adults who have certain high-risk conditions should receive one or more doses of vaccine.  Hepatitis A vaccine. Adults who wish to be protected from this disease, have chronic liver disease, work with hepatitis A-infected animals, work in hepatitis A research labs, or travel to or work in countries with a high rate of hepatitis A should be immunized. Adults who were previously unvaccinated and who anticipate close contact with an international adoptee during the first 60 days after arrival in the [place] from a country with a high rate of hepatitis A should be immunized.  Hepatitis B vaccine. Adults should be immunized if they wish to be protected from this disease, are under age 52 years and have diabetes, have chronic liver disease, have had more than one sex partner in the past 6 months, may be exposed to blood or other infectious body fluids, are household contacts or sex partners of hepatitis B positive people, are clients or workers in certain care facilities, or travel to or work in countries with a high rate of hepatitis B.  Haemophilus influenzae type b (Hib) vaccine. A previously unvaccinated person with asplenia  or sickle cell disease or having a scheduled splenectomy should receive 1 dose of Hib vaccine. Regardless of previous immunization, a recipient of a hematopoietic stem cell transplant should receive a 3-dose series 6-12 months after his successful transplant. Hib vaccine is not recommended for adults with HIV infection. Preventive Service /  Frequency Ages 92 to 71  Blood pressure check.** / Every 3-5 years.  Lipid and cholesterol check.** / Every 5 years beginning at age 89.  Hepatitis C blood test.** / For any individual with known risks for hepatitis C.  Skin self-exam. / Monthly.  Influenza vaccine. / Every year.  Tetanus, diphtheria, and acellular pertussis (Tdap, Td) vaccine.** / Consult your health care provider. 1 dose of Td every 10 years.  Varicella vaccine.** / Consult your health care provider.  HPV vaccine. / 3 doses over 6 months, if 48 or younger.  Measles, mumps, rubella (MMR) vaccine.** / You need at least 1 dose of MMR if you were born in 1957 or later. You may also need a second dose.  Pneumococcal 13-valent conjugate (PCV13) vaccine.** / Consult your health care provider.  Pneumococcal polysaccharide (PPSV23) vaccine.** / 1 to 2 doses if you smoke cigarettes or if you have certain conditions.  Meningococcal vaccine.** / 1 dose if you are age 55 to 42 years and a Market researcher living in a residence hall, or have one of several medical conditions. You may also need additional booster doses.  Hepatitis A vaccine.** / Consult your health care provider.  Hepatitis B vaccine.** / Consult your health care provider.  Haemophilus influenzae type b (Hib) vaccine.** / Consult your health care provider. Ages 47 to 72  Blood pressure check.** / Every year.  Lipid and cholesterol check.** / Every 5 years beginning at age 53.  Lung cancer screening. / Every year if you are aged 58-80 years and have a 30-pack-year history of smoking and currently smoke or have quit within the past 15 years. Yearly screening is stopped once you have quit smoking for at least 15 years or develop a health problem that would prevent you from having lung cancer treatment.  Fecal occult blood test (FOBT) of stool. / Every year beginning at age 77 and continuing until age 37. You may not have to do this test if you get a  colonoscopy every 10 years.  Flexible sigmoidoscopy** or colonoscopy.** / Every 5 years for a flexible sigmoidoscopy or every 10 years for a colonoscopy beginning at age 2 and continuing until age 79.  Hepatitis C blood test.** / For all people born from [birth year] through 1965 and any individual with known risks for hepatitis C.  Skin self-exam. / Monthly.  Influenza vaccine. / Every year.  Tetanus, diphtheria, and acellular pertussis (Tdap/Td) vaccine.** / Consult your health care provider. 1 dose of Td every 10 years.  Varicella vaccine.** / Consult your health care provider.  Zoster vaccine.** / 1 dose for adults aged 48 years or older.  Measles, mumps, rubella (MMR) vaccine.** / You need at least 1 dose of MMR if you were born in 1956 or later. You may also need a second dose.  Pneumococcal 13-valent conjugate (PCV13) vaccine.** / Consult your health care provider.  Pneumococcal polysaccharide (PPSV23) vaccine.** / 1 to 2 doses if you smoke cigarettes or if you have certain conditions.  Meningococcal vaccine.** / Consult your health care provider.  Hepatitis A vaccine.** / Consult your health care provider.  Hepatitis B vaccine.** / Consult your health care provider.  Haemophilus influenzae type b (Hib) vaccine.** / Consult your health care provider. Ages 78 and over  Blood pressure check.** / Every year.  Lipid and cholesterol check.**/ Every 5 years beginning at age 1.  Lung cancer screening. / Every year if you are aged 101-80 years and have a 30-pack-year history of smoking and currently smoke or have quit within the past 15 years. Yearly screening is stopped once you have quit smoking for at least 15 years or develop a health problem that would prevent you from having lung cancer treatment.  Fecal occult blood test (FOBT) of stool. / Every year beginning at age 7 and continuing until age 68. You may not have to do this test if you get a colonoscopy every 10  years.  Flexible sigmoidoscopy** or colonoscopy.** / Every 5 years for a flexible sigmoidoscopy or every 10 years for a colonoscopy beginning at age 36 and continuing until age 72.  Hepatitis C blood test.** / For all people born from [birth year] through 1964 and any individual with known risks for hepatitis C.  Abdominal aortic aneurysm (AAA) screening.** / A one-time screening for ages 38 to 58 years who are current or former smokers.  Skin self-exam. / Monthly.  Influenza vaccine. / Every year.  Tetanus, diphtheria, and acellular pertussis (Tdap/Td) vaccine.** / 1 dose of Td every 10 years.  Varicella vaccine.** / Consult your health care provider.  Zoster vaccine.** / 1 dose for adults aged 7 years or older.  Pneumococcal 13-valent conjugate (PCV13) vaccine.** / 1 dose for all adults aged 21 years and older.  Pneumococcal polysaccharide (PPSV23) vaccine.** / 1 dose for all adults aged 35 years and older.  Meningococcal vaccine.** / Consult your health care provider.  Hepatitis A vaccine.** / Consult your health care provider.  Hepatitis B vaccine.** / Consult your health care provider.  Haemophilus influenzae type b (Hib) vaccine.** / Consult your health care provider. **Family history and personal history of risk and conditions may change your health care provider's recommendations.   This information is not intended to replace advice given to you by your health care provider. Make sure you discuss any questions you have with your health care provider.   Document Released: 02/26/2001 Document Revised: 01/21/2014 Document Reviewed: 05/28/2010 Elsevier Interactive Patient Education [employer].

## 2015-05-15 NOTE — Assessment & Plan Note (Signed)
The natural history of prostate cancer and ongoing controversy regarding screening and potential treatment outcomes of prostate cancer has been discussed with the patient. The meaning of a false positive PSA and a false negative PSA has been discussed. He indicates understanding of the limitations of this screening test and wishes to proceed with screening PSA testing. Patient is asymptomatic and declines DRE.

## 2015-05-15 NOTE — Assessment & Plan Note (Signed)
Order HIV antibody placed.

## 2015-05-15 NOTE — Assessment & Plan Note (Signed)
Depression screen negative. Health Maintenance reviewed -- HIV screen today. Patient to check on insurance coverage for Hep C screen. Preventive schedule discussed and handout given in AVS. Will obtain fasting labs today.

## 2015-05-15 NOTE — Progress Notes (Signed)
Pre visit review using our clinic review tool, if applicable. No additional management support is needed unless otherwise documented below in the visit note/SLS  

## 2015-06-13 MED FILL — METOPROLOL SUCC ER 25 MG TA: 25 | 30 days supply | Qty: 30 | Fill #1

## 2015-06-21 MED FILL — ALPRAZolam 0.5 MG TABS: 0.5 | 30 days supply | Qty: 60 | Fill #1

## 2015-07-12 MED FILL — METOPROLOL SUCC ER 25 MG TA: 25 | 30 days supply | Qty: 30 | Fill #2

## 2015-08-03 MED FILL — ALPRAZolam 0.5 MG TABS: 0.5 | 30 days supply | Qty: 60 | Fill #2

## 2015-08-10 MED FILL — METOPROLOL SUCC ER 25 MG TA: 25 | 30 days supply | Qty: 30 | Fill #3

## 2015-09-11 MED FILL — METOPROLOL SUCC ER 25 MG TA: 25 | 30 days supply | Qty: 30 | Fill #4

## 2015-09-27 ENCOUNTER — Other Ambulatory Visit: Payer: Self-pay | Admitting: Physician Assistant

## 2015-09-27 MED ORDER — ALPRAZOLAM 0.5 MG PO TABS: 0.5000 mg | ORAL_TABLET | Freq: Two times a day (BID) | ORAL | 0 refills | Status: DC | PRN

## 2015-09-27 NOTE — Addendum Note (Signed)
Addended by: Marcelline MatesMARTIN, [place] Lasseter on: 09/27/2015 12:48 PM   Modules accepted: Orders

## 2015-09-27 NOTE — Telephone Encounter (Signed)
Pt request refill a refill for ALPRAZolam    Pharmacy: [facility] [place] - [phone] [address]

## 2015-09-27 NOTE — Telephone Encounter (Addendum)
I have printed a one-month script. He will have to pick up as I need to get a random UDS.  [place] controlled substance database reviewed. No concerning findings. A copy will be scanned into chart.

## 2015-09-27 NOTE — Telephone Encounter (Signed)
Patient informed, understood & agreed, he will p/u on Friday, provider informed/SLS 09/13

## 2015-09-27 NOTE — Telephone Encounter (Signed)
Refill request for Alprazolam 0.5 mg Last filled by MD on - 05/15/15, #60x2 Last AEX - 05/15/15 Next AEX - 6-Mths. Please Advise on refills/SLS 09/13

## 2015-09-29 ENCOUNTER — Other Ambulatory Visit: Payer: Managed Care, Other (non HMO)

## 2015-09-29 ENCOUNTER — Encounter: Payer: Self-pay | Admitting: Physician Assistant

## 2015-09-29 MED FILL — ALPRAZolam 0.5 MG TABS: 0.5 | 30 days supply | Qty: 60 | Fill #0

## 2015-10-12 ENCOUNTER — Other Ambulatory Visit: Payer: Self-pay | Admitting: Physician Assistant

## 2015-10-12 MED ORDER — METOPROLOL SUCCINATE ER 25 MG PO TB24: 25.0000 mg | ORAL_TABLET | Freq: Every day | ORAL | 0 refills | Status: DC

## 2015-10-12 MED FILL — METOPROLOL SUCC ER 25 MG TA: 25 | 30 days supply | Qty: 30 | Fill #0

## 2015-10-12 NOTE — Telephone Encounter (Signed)
Refill done to [facility]

## 2015-10-12 NOTE — Telephone Encounter (Signed)
Self. Refill request for metoprolol succinate   Pharmacy: [facility] [place], [place] - [phone]

## 2015-10-20 ENCOUNTER — Encounter: Payer: Self-pay | Admitting: Physician Assistant

## 2015-10-20 ENCOUNTER — Ambulatory Visit (INDEPENDENT_AMBULATORY_CARE_PROVIDER_SITE_OTHER): Payer: Managed Care, Other (non HMO) | Admitting: Physician Assistant

## 2015-10-20 VITALS — BP 98/68 | HR 76 | Temp 98.2°F | Resp 16 | Ht 67.0 in | Wt 190.4 lb

## 2015-10-20 DIAGNOSIS — R5383 Other fatigue: Secondary | ICD-10-CM

## 2015-10-20 DIAGNOSIS — M5010 Cervical disc disorder with radiculopathy, unspecified cervical region: Secondary | ICD-10-CM | POA: Diagnosis not present

## 2015-10-20 DIAGNOSIS — M62838 Other muscle spasm: Secondary | ICD-10-CM

## 2015-10-20 LAB — CBC
HCT: 48.6 % (ref 38.5–50.0)
Hemoglobin: 16.4 g/dL (ref 13.2–17.1)
MCH: 30.4 pg (ref 27.0–33.0)
MCHC: 33.7 g/dL (ref 32.0–36.0)
MCV: 90 fL (ref 80.0–100.0)
MPV: 9.5 fL (ref 7.5–12.5)
PLATELETS: 250 10*3/uL (ref 140–400)
RBC: 5.4 MIL/uL (ref 4.20–5.80)
RDW: 13.2 % (ref 11.0–15.0)
WBC: 8.9 10*3/uL (ref 3.8–10.8)

## 2015-10-20 LAB — COMPREHENSIVE METABOLIC PANEL
ALBUMIN: 4.1 g/dL (ref 3.6–5.1)
ALT: 26 U/L (ref 9–46)
AST: 22 U/L (ref 10–35)
Alkaline Phosphatase: 54 U/L (ref 40–115)
BUN: 14 mg/dL (ref 7–25)
CHLORIDE: 104 mmol/L (ref 98–110)
CO2: 25 mmol/L (ref 20–31)
Calcium: 9.2 mg/dL (ref 8.6–10.3)
Creat: 1.16 mg/dL (ref 0.70–1.33)
Glucose, Bld: 78 mg/dL (ref 65–99)
POTASSIUM: 4 mmol/L (ref 3.5–5.3)
SODIUM: 138 mmol/L (ref 135–146)
TOTAL PROTEIN: 6.7 g/dL (ref 6.1–8.1)
Total Bilirubin: 1.1 mg/dL (ref 0.2–1.2)

## 2015-10-20 LAB — TSH: TSH: 2.05 m[IU]/L (ref 0.40–4.50)

## 2015-10-20 LAB — VITAMIN B12: VITAMIN B 12: 769 pg/mL (ref 200–1100)

## 2015-10-20 MED ORDER — CYCLOBENZAPRINE HCL 10 MG PO TABS: 10.0000 mg | ORAL_TABLET | Freq: Three times a day (TID) | ORAL | 0 refills | Status: DC | PRN

## 2015-10-20 MED ORDER — METHYLPREDNISOLONE 4 MG PO TBPK: ORAL_TABLET | ORAL | 0 refills | Status: DC

## 2015-10-20 MED FILL — CYCLOBENZAPRINE 10 MG TAB: 10 | 10 days supply | Qty: 30 | Fill #0

## 2015-10-20 MED FILL — METHYLPREDNISOLONE 4 MG TAB: 4 | 6 days supply | Qty: 21 | Fill #0

## 2015-10-20 NOTE — Patient Instructions (Signed)
Please take steroid as directed. Continue muscle relaxer as directed. Limit heavy lifting.  Wear head phones at work to help prevent damage to the ears. This can cause ringing.  For the fatigue, I am checking several things today. I will call you with your results. Stay well hydrated, eat a well-balanced diet and take a multivitamin.  I am setting you up for an MRI of the neck. You will be contacted to schedule.  Follow-up will be based on results.

## 2015-10-20 NOTE — Progress Notes (Signed)
Patient presents to clinic today c/o chronic fatigue over the past several months. Notes that he just does not have the same energy that he used to. Denies chest pain or dyspnea with exertion. Denies palpitations. Denies change to diet/exercise or sleep regimens. Has history of anxiety and depression, currently on regimen of alprazolam PRN. Has done well with this regimen. Denies any depressed mood or anhedonia recently. Endorses anxiety well-controlled.  Patient also endorses neck pain with radiation into R arm with intermittent numbness and tingling. Denies weakness of extremity. Patient with history of arthritis in the neck. Denies trauma or injury. Denies decreased grip strength. Has not taken anything for symptoms.   Past Medical History:  Diagnosis Date  . Alcoholism /alcohol abuse ([place])   . Anxiety and depression   . Colon polyps   . Hepatitis B 1977  . Hyperlipidemia   . Hypertension     Current Outpatient Prescriptions on File Prior to Visit  Medication Sig Dispense Refill  . ALPRAZolam (XANAX) 0.5 MG tablet Take 1 tablet (0.5 mg total) by mouth 2 (two) times daily as needed. for anxiety 60 tablet 0  . metoprolol succinate (TOPROL-XL) 25 MG 24 hr tablet Take 1 tablet (25 mg total) by mouth daily. 90 tablet 0   No current facility-administered medications on file prior to visit.     Allergies  Allergen Reactions  . Sulfa Antibiotics     Family History  Problem Relation Age of Onset  . Aneurysm Mother     Heart  . Hyperlipidemia Mother   . Hypertension Mother   . Epilepsy Brother   . Healthy Brother     x1  . Healthy Son     x2  . Cancer Father 62    Stage IV Liver Cancer with mets to Colon    Social History   Social History  . Marital status: Married    Spouse name: N/A  . Number of children: N/A  . Years of education: N/A   Social History Main Topics  . Smoking status: Current Every Day Smoker    Packs/day: 1.50    Years: 30.00  . Smokeless tobacco:  Never Used  . Alcohol use 6.0 oz/week    10 Cans of beer per week  . Drug use: No  . Sexual activity: No   Other Topics Concern  . None   Social History Narrative  . None   Review of Systems - See HPI.  All other ROS are negative.  BP 98/68 (BP Location: Left Arm, Patient Position: Sitting, Cuff Size: Large)   Pulse 76   Temp 98.2 F (36.8 C) (Oral)   Resp 16   Ht '5\' 7"'  (1.702 m)   Wt 190 lb 6 oz (86.4 kg)   SpO2 97%   BMI 29.82 kg/m   Physical Exam  Constitutional: He is oriented to person, place, and time and well-developed, well-nourished, and in no distress.  HENT:  Head: Normocephalic and atraumatic.  Eyes: Conjunctivae are normal. Pupils are equal, round, and reactive to light.  Neck: Neck supple. No spinous process tenderness and no muscular tenderness present.  Cardiovascular: Normal rate, regular rhythm, normal heart sounds and intact distal pulses.   Pulmonary/Chest: Effort normal and breath sounds normal. No respiratory distress. He has no wheezes. He has no rales. He exhibits no tenderness.  Musculoskeletal:       Right shoulder: Normal.       Cervical back: He exhibits pain. He exhibits no tenderness and  no bony tenderness.  Neurological: He is alert and oriented to person, place, and time.  Skin: Skin is warm and dry. No rash noted.  Psychiatric: Affect normal.  Vitals reviewed.  Assessment/Plan: 1. Muscle spasm Flexeril refilled. - cyclobenzaprine (FLEXERIL) 10 MG tablet; Take 1 tablet (10 mg total) by mouth 3 (three) times daily as needed for muscle spasms.  Dispense: 30 tablet; Refill: 0  2. Other fatigue Unclear etiology. Likely multifactorial. Discussed supportive measures. Will obtain lab panel today to further assess. - CBC - Comp Met (CMET) - TSH - B12 - Vitamin D 1,25 dihydroxy  3. Cervical disc disorder with radiculopathy of cervical region Will obtain MRI to further assess. No alarm signs/symptoms. Will begin Medrol pack and muscle  relaxant Pain medications discussed.  - MR Cervical Spine Wo Contrast; Future   Leeanne Rio, PA-C

## 2015-10-20 NOTE — Progress Notes (Signed)
Pre visit review using our clinic review tool, if applicable. No additional management support is needed unless otherwise documented below in the visit note/SLS  

## 2015-10-22 LAB — VITAMIN D 1,25 DIHYDROXY
VITAMIN D3 1, 25 (OH): 27 pg/mL
Vitamin D 1, 25 (OH)2 Total: 27 pg/mL (ref 18–72)
Vitamin D2 1, 25 (OH)2: <8 pg/mL

## 2015-10-26 ENCOUNTER — Other Ambulatory Visit: Payer: Self-pay | Admitting: Physician Assistant

## 2015-10-26 ENCOUNTER — Telehealth: Payer: Self-pay | Admitting: Physician Assistant

## 2015-10-26 DIAGNOSIS — Z1389 Encounter for screening for other disorder: Secondary | ICD-10-CM

## 2015-10-26 NOTE — Telephone Encounter (Signed)
Patient was made aware of the below lab results and provider's recommendations. He verbalized understanding and declined appointment for lab draw at this time. Patient did not have any other questions or concerns prior to call ending.  Notes Recorded by Waldon MerlWilliam C Martin, PA-C on 10/23/2015 at 6:40 AM EDT Labs look good overall. I think pain is contributing to fatigue. Will know more once MRI results are in. Another thing to consider is low testosterone as cause of his fatigue. Can check this if he would like but would need to be an early morning lab draw for accuracy.

## 2015-10-26 NOTE — Telephone Encounter (Signed)
Relation to ZO:XWRUpt:self Call back number: [phone]   Reason for call:  Patient returning call regarding lab results. Please advise

## 2015-10-28 ENCOUNTER — Ambulatory Visit (HOSPITAL_BASED_OUTPATIENT_CLINIC_OR_DEPARTMENT_OTHER)
Admission: RE | Admit: 2015-10-28 | Discharge: 2015-10-28 | Disposition: A | Discharge status: Home / Self Care | Payer: Managed Care, Other (non HMO) | Source: Ambulatory Visit | Attending: Physician Assistant | Admitting: Physician Assistant

## 2015-10-28 DIAGNOSIS — M50221 Other cervical disc displacement at C4-C5 level: Secondary | ICD-10-CM | POA: Diagnosis not present

## 2015-10-28 DIAGNOSIS — M5010 Cervical disc disorder with radiculopathy, unspecified cervical region: Secondary | ICD-10-CM

## 2015-10-28 DIAGNOSIS — M4802 Spinal stenosis, cervical region: Secondary | ICD-10-CM | POA: Diagnosis not present

## 2015-10-28 DIAGNOSIS — Z1389 Encounter for screening for other disorder: Secondary | ICD-10-CM | POA: Diagnosis not present

## 2015-10-28 DIAGNOSIS — M50223 Other cervical disc displacement at C6-C7 level: Secondary | ICD-10-CM | POA: Insufficient documentation

## 2015-10-28 DIAGNOSIS — M50222 Other cervical disc displacement at C5-C6 level: Secondary | ICD-10-CM | POA: Diagnosis not present

## 2015-10-28 DIAGNOSIS — Z0189 Encounter for other specified special examinations: Secondary | ICD-10-CM

## 2015-10-30 ENCOUNTER — Other Ambulatory Visit: Payer: Self-pay | Admitting: *Deleted

## 2015-10-30 DIAGNOSIS — M5412 Radiculopathy, cervical region: Secondary | ICD-10-CM

## 2015-10-30 DIAGNOSIS — M4802 Spinal stenosis, cervical region: Secondary | ICD-10-CM

## 2015-10-30 DIAGNOSIS — M5020 Other cervical disc displacement, unspecified cervical region: Secondary | ICD-10-CM

## 2015-11-14 MED FILL — METOPROLOL SUCC ER 25 MG TA: 25 | 30 days supply | Qty: 30 | Fill #1

## 2015-11-27 ENCOUNTER — Telehealth: Payer: Self-pay | Admitting: Physician Assistant

## 2015-11-27 MED ORDER — ALPRAZOLAM 0.5 MG PO TABS: 0.5000 mg | ORAL_TABLET | Freq: Two times a day (BID) | ORAL | 0 refills | Status: DC | PRN

## 2015-11-27 MED FILL — ALPRAZolam 0.5 MG TABS: 0.5 | 30 days supply | Qty: 60 | Fill #0

## 2015-11-27 NOTE — Telephone Encounter (Signed)
Ok to give quantity 60. Will need FU at new office for further refills as we would need to update CSC.

## 2015-11-27 NOTE — Telephone Encounter (Signed)
Rx signed and faxed to [facility] per patient

## 2015-11-27 NOTE — Telephone Encounter (Signed)
Xanax Last rx: 09/27/15 #60 Last Ov: 10/20/15 Fatigue CSC signed 08/26/14, last UDS 09/29/15

## 2015-11-27 NOTE — Telephone Encounter (Signed)
Pt needs refill on xanax, [facility] in [place]

## 2015-11-27 NOTE — Telephone Encounter (Signed)
error:[id number] ° °

## 2015-11-27 NOTE — Addendum Note (Signed)
Addended by: Con MemosMOORE, Noele Icenhour S on: 11/27/2015 12:09 PM   Modules accepted: Orders

## 2015-12-15 MED FILL — METOPROLOL SUCC ER 25 MG TA: 25 | 30 days supply | Qty: 30 | Fill #2

## 2016-01-11 ENCOUNTER — Ambulatory Visit: Payer: Managed Care, Other (non HMO) | Admitting: Family Medicine

## 2016-01-16 ENCOUNTER — Telehealth: Payer: Self-pay | Admitting: Physician Assistant

## 2016-01-16 MED ORDER — ALPRAZOLAM 0.5 MG PO TABS
0.5000 mg | ORAL_TABLET | Freq: Two times a day (BID) | ORAL | 0 refills | Status: DC | PRN
Start: 2016-01-16 — End: 2016-03-22

## 2016-01-16 MED ORDER — METOPROLOL SUCCINATE ER 25 MG PO TB24: 25.0000 mg | ORAL_TABLET | Freq: Every day | ORAL | 1 refills | Status: DC

## 2016-01-16 MED FILL — METOPROLOL SUCC ER 25 MG TA: 25 | 30 days supply | Qty: 30 | Fill #0 | Status: TO

## 2016-01-16 NOTE — Addendum Note (Signed)
Addended by: Con MemosMOORE, Shalonda Sachse S on: 01/16/2016 02:53 PM   Modules accepted: Orders

## 2016-01-16 NOTE — Telephone Encounter (Signed)
Pt states that he needs refills on xanax & metorolol[facility]

## 2016-01-16 NOTE — Telephone Encounter (Signed)
Ok to give 30 days of BP medication with 1 refill.  For Xanax, ok to give 60 tablets with 0 refill. Needs FU next month for reassessment.

## 2016-01-16 NOTE — Telephone Encounter (Signed)
Advised patient we were sending in #60 of the Xanax and refill of the [facility]. Patient will call back to schedule an follow up appointment for next month.

## 2016-01-16 NOTE — Telephone Encounter (Signed)
Faxed rx for [facility] to [facility]

## 2016-01-16 NOTE — Telephone Encounter (Signed)
Xanax last filled 11/27/15 #60 Last Ov: 10/20/15 Please advise

## 2016-02-02 ENCOUNTER — Ambulatory Visit (INDEPENDENT_AMBULATORY_CARE_PROVIDER_SITE_OTHER): Payer: Managed Care, Other (non HMO) | Admitting: Physician Assistant

## 2016-02-02 ENCOUNTER — Encounter: Payer: Self-pay | Admitting: Physician Assistant

## 2016-02-02 VITALS — BP 110/80 | HR 76 | Temp 98.1°F | Resp 16 | Ht 67.0 in | Wt 188.0 lb

## 2016-02-02 DIAGNOSIS — F418 Other specified anxiety disorders: Secondary | ICD-10-CM | POA: Diagnosis not present

## 2016-02-02 DIAGNOSIS — F419 Anxiety disorder, unspecified: Secondary | ICD-10-CM

## 2016-02-02 DIAGNOSIS — F32A Depression, unspecified: Secondary | ICD-10-CM

## 2016-02-02 DIAGNOSIS — M5090 Cervical disc disorder, unspecified, unspecified cervical region: Secondary | ICD-10-CM | POA: Diagnosis not present

## 2016-02-02 DIAGNOSIS — F329 Major depressive disorder, single episode, unspecified: Secondary | ICD-10-CM

## 2016-02-02 MED ORDER — CITALOPRAM HYDROBROMIDE 10 MG PO TABS: 10.0000 mg | ORAL_TABLET | Freq: Every day | ORAL | 0 refills | Status: DC

## 2016-02-02 NOTE — Progress Notes (Signed)
Patient presents to clinic today for follow-up of mood and chronic pain.  Anxiety/Depression -- endorses good mood overall but anxiety still present on a daily basis. Mainly work-related. Is taking 1/2 tablet Xanax twice daily. Denies ever taking more. Denies SI/HI.   Chronic cervical neck pain, followed by Neurosurgery after MRI revealed multilevel disc disease with several herniated disc (C2-3,C4-5,C5-6,C6-7). Surgery and PT was recommended. Patient wanted to avoid surgery. Cannot afford physical therapy.  Endorses pain at a 5/10 most days. Is taking Aleve PRN. Is taking Flexeril as needed for spasms, mainly at night. Endorses occasional tingling of hands bilaterally without numbness.   Past Medical History:  Diagnosis Date  . Alcoholism /alcohol abuse ([facility])   . Anxiety and depression   . Colon polyps   . Hepatitis B 1977  . Hyperlipidemia   . Hypertension     Current Outpatient Prescriptions on File Prior to Visit  Medication Sig Dispense Refill  . ALPRAZolam (XANAX) 0.5 MG tablet Take 1 tablet (0.5 mg total) by mouth 2 (two) times daily as needed. for anxiety 60 tablet 0  . cyclobenzaprine (FLEXERIL) 10 MG tablet Take 1 tablet (10 mg total) by mouth 3 (three) times daily as needed for muscle spasms. 30 tablet 0  . metoprolol succinate (TOPROL-XL) 25 MG 24 hr tablet Take 1 tablet (25 mg total) by mouth daily. 30 tablet 1   No current facility-administered medications on file prior to visit.     Allergies  Allergen Reactions  . Sulfa Antibiotics     Family History  Problem Relation Age of Onset  . Cancer Father 12    Stage IV Liver Cancer with mets to Colon  . Aneurysm Mother     Heart  . Hyperlipidemia Mother   . Hypertension Mother   . Epilepsy Brother   . Healthy Brother     x1  . Healthy Son     x2    Social History   Social History  . Marital status: Married    Spouse name: N/A  . Number of children: N/A  . Years of education: N/A   Social History Main  Topics  . Smoking status: Current Every Day Smoker    Packs/day: 1.50    Years: 30.00  . Smokeless tobacco: Never Used  . Alcohol use 6.0 oz/week    10 Cans of beer per week  . Drug use: No  . Sexual activity: No   Other Topics Concern  . None   Social History Narrative  . None   Review of Systems - See HPI.  All other ROS are negative.  BP 110/80   Pulse 76   Temp 98.1 F (36.7 C) (Oral)   Resp 16   Ht 5\' 7"  (1.702 m)   Wt 188 lb (85.3 kg)   SpO2 97%   BMI 29.44 kg/m   Physical Exam  Constitutional: He is oriented to person, place, and time and well-developed, well-nourished, and in no distress.  HENT:  Head: Normocephalic and atraumatic.  Eyes: Conjunctivae are normal.  Neck: Neck supple.  Cardiovascular: Normal rate, regular rhythm, normal heart sounds and intact distal pulses.   Pulmonary/Chest: Effort normal and breath sounds normal. No respiratory distress. He has no wheezes. He has no rales. He exhibits no tenderness.  Neurological: He is alert and oriented to person, place, and time.  Skin: Skin is warm and dry. No rash noted.  Psychiatric: Affect normal.  Vitals reviewed.  Assessment/Plan: Cervical neck pain with evidence  of disc disease Patient to schedule FU with Neurosurgery.  Giving chronic use of benzodiazepines, will defer pain management to specialist. OTC medications reviewed.   Anxiety and depression Will add-on Citalopram 10 mg daily. Continue Xanax PRN. FU scheduled. Alarm signs/symptoms discussed with patient.     Piedad ClimesMartin, Ritu Gagliardo Cody, PA-C

## 2016-02-02 NOTE — Progress Notes (Signed)
Pre visit review using our clinic review tool, if applicable. No additional management support is needed unless otherwise documented below in the visit note. 

## 2016-02-02 NOTE — Patient Instructions (Addendum)
Please continue chronic medications as directed. Start the Citalopram once daily as directed. This should help further with mood.  Follow-up in 4-6 weeks for reassessment.   If you note any worsening mood on the medication, stop it immediately and call me.

## 2016-02-05 NOTE — Assessment & Plan Note (Signed)
Will add-on Citalopram 10 mg daily. Continue Xanax PRN. FU scheduled. Alarm signs/symptoms discussed with patient.

## 2016-02-05 NOTE — Assessment & Plan Note (Signed)
Patient to schedule FU with Neurosurgery.  Giving chronic use of benzodiazepines, will defer pain management to specialist. OTC medications reviewed.

## 2016-02-16 ENCOUNTER — Telehealth: Payer: Self-pay | Admitting: Physician Assistant

## 2016-02-16 NOTE — Telephone Encounter (Signed)
UDS results are in.  There was a preliminary positive for THC. Cannot test positive for this again and receive controlled medications from us.  High risk -- repeat UDS at next fill.

## 2016-02-20 NOTE — Telephone Encounter (Signed)
Advised patient his UDS showed positive for marijuana. Patient did not deny medication. Advised high risk of side effects of marijuana with controlled medications. Will repeat UDS at next appointment. Patient is due for f/u 4-6 weeks around March 2 for recheck mood. Patient states he tried the Citalopram once and started feeling nervous, cold chills, vision changes, the next morning unable to perform work duties as regular. So he stopped the medication

## 2016-02-20 NOTE — Telephone Encounter (Signed)
Would recommend we consider starting a different medication. Could try Fluoxetine at low dose if he is willing.

## 2016-02-29 NOTE — Telephone Encounter (Signed)
Patient states he is not wanting to start any anti-depressant. He states he still taking his 1/2 tablet of Xanax. He agreed he would call back to schedule an appointment for follow up.

## 2016-03-18 ENCOUNTER — Other Ambulatory Visit: Payer: Self-pay | Admitting: Emergency Medicine

## 2016-03-18 ENCOUNTER — Telehealth: Payer: Self-pay | Admitting: Physician Assistant

## 2016-03-18 MED ORDER — METOPROLOL SUCCINATE ER 25 MG PO TB24
25.0000 mg | ORAL_TABLET | Freq: Every day | ORAL | 3 refills | Status: DC
Start: 2016-03-18 — End: 2016-07-02

## 2016-03-18 NOTE — Telephone Encounter (Signed)
Pt needs refill on metoprolol succinate, [facility] [place], Pt has scheduled an appt for this Friday @ 11:00 for a follow up.

## 2016-03-18 NOTE — Telephone Encounter (Signed)
Rx sent to the preferred patient pharmacy  

## 2016-03-22 ENCOUNTER — Encounter: Payer: Self-pay | Admitting: Physician Assistant

## 2016-03-22 ENCOUNTER — Ambulatory Visit (INDEPENDENT_AMBULATORY_CARE_PROVIDER_SITE_OTHER): Payer: 59 | Admitting: Physician Assistant

## 2016-03-22 VITALS — BP 120/84 | HR 75 | Temp 98.1°F | Resp 14 | Ht 67.0 in | Wt 185.0 lb

## 2016-03-22 DIAGNOSIS — M62838 Other muscle spasm: Secondary | ICD-10-CM | POA: Diagnosis not present

## 2016-03-22 DIAGNOSIS — F329 Major depressive disorder, single episode, unspecified: Secondary | ICD-10-CM

## 2016-03-22 DIAGNOSIS — F419 Anxiety disorder, unspecified: Principal | ICD-10-CM

## 2016-03-22 DIAGNOSIS — F418 Other specified anxiety disorders: Secondary | ICD-10-CM

## 2016-03-22 DIAGNOSIS — F32A Depression, unspecified: Secondary | ICD-10-CM

## 2016-03-22 MED ORDER — ALPRAZOLAM 0.5 MG PO TABS: 0.5000 mg | ORAL_TABLET | Freq: Two times a day (BID) | ORAL | 0 refills | Status: DC | PRN

## 2016-03-22 MED ORDER — CYCLOBENZAPRINE HCL 10 MG PO TABS: 10.0000 mg | ORAL_TABLET | Freq: Three times a day (TID) | ORAL | 3 refills | Status: AC | PRN

## 2016-03-22 NOTE — Progress Notes (Signed)
Pre visit review using our clinic review tool, if applicable. No additional management support is needed unless otherwise documented below in the visit note. 

## 2016-03-22 NOTE — Progress Notes (Signed)
Patient presents to clinic today for follow-up of anxiety state. Patient was started in Citalopram 10 mg at last visit to try and get better control of overall anxiety. States he took twice and noted significant anxiety with inattention and tearfulness. Noted difficulty with focus and erratic thoughts each time. Endorses stopping medication after that. Has taken his alprazolam as directed. States he is doing very well at present with anxiety. Does not want to start additional medications at this time. .    Past Medical History:  Diagnosis Date  . Alcoholism /alcohol abuse ([facility])   . Anxiety and depression   . Colon polyps   . Hepatitis B 1977  . Hyperlipidemia   . Hypertension     Current Outpatient Prescriptions on File Prior to Visit  Medication Sig Dispense Refill  . metoprolol succinate (TOPROL-XL) 25 MG 24 hr tablet Take 1 tablet (25 mg total) by mouth daily. 30 tablet 3   No current facility-administered medications on file prior to visit.     Allergies  Allergen Reactions  . Citalopram Other (See Comments)    Severe anxiety, tearfulness, thoughts of harm.   . Sulfa Antibiotics     Family History  Problem Relation Age of Onset  . Cancer Father 1985    Stage IV Liver Cancer with mets to Colon  . Aneurysm Mother     Heart  . Hyperlipidemia Mother   . Hypertension Mother   . Epilepsy Brother   . Healthy Brother     x1  . Healthy Son     x2    Social History   Social History  . Marital status: Married    Spouse name: N/A  . Number of children: N/A  . Years of education: N/A   Social History Main Topics  . Smoking status: Current Every Day Smoker    Packs/day: 1.50    Years: 30.00  . Smokeless tobacco: Never Used  . Alcohol use 6.0 oz/week    10 Cans of beer per week  . Drug use: No  . Sexual activity: No   Other Topics Concern  . None   Social History Narrative  . None    Review of Systems - See HPI.  All other ROS are negative.  BP 120/84    Pulse 75   Temp 98.1 F (36.7 C) (Oral)   Resp 14   Ht 5\' 7"  (1.702 m)   Wt 185 lb (83.9 kg)   SpO2 96%   BMI 28.98 kg/m   Physical Exam  Constitutional: He is oriented to person, place, and time and well-developed, well-nourished, and in no distress.  HENT:  Head: Normocephalic and atraumatic.  Right Ear: External ear normal.  Left Ear: External ear normal.  Nose: Nose normal.  Mouth/Throat: Oropharynx is clear and moist. No oropharyngeal exudate.  TM within normal limits bilaterally.  Eyes: Conjunctivae are normal.  Neck: Neck supple.  Cardiovascular: Normal rate, regular rhythm, normal heart sounds and intact distal pulses.   Pulmonary/Chest: Effort normal and breath sounds normal. No respiratory distress. He has no wheezes. He has no rales. He exhibits no tenderness.  Lymphadenopathy:    He has no cervical adenopathy.  Neurological: He is alert and oriented to person, place, and time. No cranial nerve deficit. Gait normal. Coordination normal. GCS score is 15.  Skin: Skin is warm and dry. No rash noted.  Psychiatric: Affect normal.  Vitals reviewed.  Assessment/Plan: Anxiety and depression Significant side effect to Citalopram. Is hesitant  to start another SSRI or other class of medication. Is doing very well at present. Discussed with him the + THC on prior UDS. Discussed that if this tests positive again, he will not be able to receive his alprazolam from our office any more. Will continue once daily dosing.     Piedad Climes, PA-C

## 2016-03-22 NOTE — Patient Instructions (Addendum)
Please continue medications as directed. We will avoid any new SSRI medications at present.   I will call with your results today.  Please contact neurosurgery for follow-up of neck pain.  Please speak with the front desk if you need the number.

## 2016-03-24 NOTE — Assessment & Plan Note (Signed)
Significant side effect to Citalopram. Is hesitant to start another SSRI or other class of medication. Is doing very well at present. Discussed with him the + THC on prior UDS. Discussed that if this tests positive again, he will not be able to receive his alprazolam from our office any more. Will continue once daily dosing.

## 2016-05-13 ENCOUNTER — Other Ambulatory Visit: Payer: Self-pay | Admitting: Physician Assistant

## 2016-05-13 MED ORDER — ALPRAZOLAM 0.5 MG PO TABS: 0.5000 mg | ORAL_TABLET | Freq: Two times a day (BID) | ORAL | 0 refills | Status: DC | PRN

## 2016-05-13 NOTE — Telephone Encounter (Signed)
Faxed to the [facility] in [place] per patient

## 2016-05-13 NOTE — Telephone Encounter (Signed)
Xanax  Last filled 03/22/16 #60 CSC: 09/12/14 UDS: 03/22/16 Moderate risk  Please advise

## 2016-05-13 NOTE — Telephone Encounter (Signed)
Pt asking for refill on xanax, [facility] [place].

## 2016-05-24 ENCOUNTER — Encounter: Payer: 59 | Admitting: Physician Assistant

## 2016-05-31 ENCOUNTER — Encounter: Payer: Self-pay | Admitting: Physician Assistant

## 2016-05-31 ENCOUNTER — Ambulatory Visit (INDEPENDENT_AMBULATORY_CARE_PROVIDER_SITE_OTHER): Payer: 59 | Admitting: Physician Assistant

## 2016-05-31 VITALS — BP 118/80 | HR 75 | Temp 98.5°F | Resp 14 | Ht 67.0 in | Wt 190.0 lb

## 2016-05-31 DIAGNOSIS — M545 Low back pain, unspecified: Secondary | ICD-10-CM

## 2016-05-31 DIAGNOSIS — F419 Anxiety disorder, unspecified: Secondary | ICD-10-CM | POA: Diagnosis not present

## 2016-05-31 DIAGNOSIS — R160 Hepatomegaly, not elsewhere classified: Secondary | ICD-10-CM | POA: Diagnosis not present

## 2016-05-31 DIAGNOSIS — Z0000 Encounter for general adult medical examination without abnormal findings: Secondary | ICD-10-CM | POA: Diagnosis not present

## 2016-05-31 DIAGNOSIS — F329 Major depressive disorder, single episode, unspecified: Secondary | ICD-10-CM | POA: Diagnosis not present

## 2016-05-31 DIAGNOSIS — I10 Essential (primary) hypertension: Secondary | ICD-10-CM | POA: Diagnosis not present

## 2016-05-31 DIAGNOSIS — F32A Depression, unspecified: Secondary | ICD-10-CM

## 2016-05-31 LAB — COMPREHENSIVE METABOLIC PANEL
ALBUMIN: 4.5 g/dL (ref 3.5–5.2)
ALT: 22 U/L (ref 0–53)
AST: 19 U/L (ref 0–37)
Alkaline Phosphatase: 62 U/L (ref 39–117)
BUN: 11 mg/dL (ref 6–23)
CHLORIDE: 106 meq/L (ref 96–112)
CO2: 27 meq/L (ref 19–32)
CREATININE: 1.02 mg/dL (ref 0.40–1.50)
Calcium: 9.4 mg/dL (ref 8.4–10.5)
GFR: 79.32 mL/min (ref >60.00)
GLUCOSE: 94 mg/dL (ref 70–99)
POTASSIUM: 4.2 meq/L (ref 3.5–5.1)
Sodium: 139 mEq/L (ref 135–145)
Total Bilirubin: 0.9 mg/dL (ref 0.2–1.2)
Total Protein: 6.9 g/dL (ref 6.0–8.3)

## 2016-05-31 LAB — URINALYSIS, ROUTINE W REFLEX MICROSCOPIC
BILIRUBIN URINE: NEGATIVE
Hgb urine dipstick: NEGATIVE
Ketones, ur: NEGATIVE
Leukocytes, UA: NEGATIVE
NITRITE: NEGATIVE
PH: 6 (ref 5.0–8.0)
TOTAL PROTEIN, URINE-UPE24: NEGATIVE
Urine Glucose: NEGATIVE
Urobilinogen, UA: 0.2 (ref 0.0–1.0)
WBC, UA: NONE SEEN — AB (ref 0–?)

## 2016-05-31 LAB — LIPID PANEL
CHOL/HDL RATIO: 4
CHOLESTEROL: 166 mg/dL (ref 0–200)
HDL: 37.3 mg/dL — AB (ref >39.00)
LDL CALC: 99 mg/dL (ref 0–99)
NonHDL: 128.8
TRIGLYCERIDES: 149 mg/dL (ref 0.0–149.0)
VLDL: 29.8 mg/dL (ref 0.0–40.0)

## 2016-05-31 LAB — CBC
HEMATOCRIT: 46.9 % (ref 39.0–52.0)
Hemoglobin: 16.2 g/dL (ref 13.0–17.0)
MCHC: 34.5 g/dL (ref 30.0–36.0)
MCV: 87.7 fl (ref 78.0–100.0)
Platelets: 253 10*3/uL (ref 150.0–400.0)
RBC: 5.35 Mil/uL (ref 4.22–5.81)
RDW: 13.2 % (ref 11.5–15.5)
WBC: 6.9 10*3/uL (ref 4.0–10.5)

## 2016-05-31 LAB — PSA: PSA: 3.41 ng/mL (ref 0.10–4.00)

## 2016-05-31 LAB — HEMOGLOBIN A1C: Hgb A1c MFr Bld: 6 % (ref 4.6–6.5)

## 2016-05-31 MED ORDER — MELOXICAM 15 MG PO TABS: 15.0000 mg | ORAL_TABLET | Freq: Every day | ORAL | 0 refills | Status: DC

## 2016-05-31 NOTE — Progress Notes (Signed)
Patient presents to clinic today for annual exam.  Patient is fasting for labs.  Acute Concerns: Patient c/o 1 week of bilateral lower back pain, worse with bending. Denies known trauma or injury but does heavy lifting at work. Denies radiation of symptoms. Has not taken anything for pain.   Chronic Issues: Anxiety/Depression -- Patient without current depressed mood or anhedonia. Is taking his alprazolam PRN for acute anxiety. Has noted increased use of this recently due to significant financial stressors  Alcohol Abuse -- + history. Is currently having around 5-6 beers per week. States this is mainly on weekends. Denies history of abnormal liver function.  Hypertension -- Patient currently on a regimen of Toprol XL 25 mg daily. Is taking as directed. Denies side effect of medication. Patient denies chest pain, palpitations, lightheadedness, dizziness, vision changes or frequent headaches.  BP Readings from Last 3 Encounters:  05/31/16 118/80  03/22/16 120/84  02/02/16 110/80   Hyperlipidemia -- Patient is not currently on medication. Has been working on diet and exercise. Body mass index is 29.76 kg/m.  Health Maintenance: Immunizations -- up-to-date Colonoscopy -- up-to-date  Past Medical History:  Diagnosis Date  . Alcoholism /alcohol abuse (HCC)   . Anxiety and depression   . Colon polyps   . Hepatitis B 1977  . Hyperlipidemia   . Hypertension     Past Surgical History:  Procedure Laterality Date  . APPENDECTOMY  1970  . LIVER BIOPSY  1977   Hepatitis B  . WISDOM TOOTH EXTRACTION      Current Outpatient Prescriptions on File Prior to Visit  Medication Sig Dispense Refill  . ALPRAZolam (XANAX) 0.5 MG tablet Take 1 tablet (0.5 mg total) by mouth 2 (two) times daily as needed. for anxiety 60 tablet 0  . cyclobenzaprine (FLEXERIL) 10 MG tablet Take 1 tablet (10 mg total) by mouth 3 (three) times daily as needed for muscle spasms. 30 tablet 3  . metoprolol  succinate (TOPROL-XL) 25 MG 24 hr tablet Take 1 tablet (25 mg total) by mouth daily. 30 tablet 3   No current facility-administered medications on file prior to visit.     Allergies  Allergen Reactions  . Citalopram Other (See Comments)    Severe anxiety, tearfulness, thoughts of harm.   . Sulfa Antibiotics     Family History  Problem Relation Age of Onset  . Cancer Father 44       Stage IV Liver Cancer with mets to Colon  . Aneurysm Mother        Heart  . Hyperlipidemia Mother   . Hypertension Mother   . Epilepsy Brother   . Healthy Brother        x1  . Healthy Son        x2    Social History   Social History  . Marital status: Married    Spouse name: N/A  . Number of children: N/A  . Years of education: N/A   Occupational History  . Not on file.   Social History Main Topics  . Smoking status: Current Every Day Smoker    Packs/day: 1.50    Years: 30.00    Types: Cigarettes  . Smokeless tobacco: Never Used  . Alcohol use 6.0 oz/week    10 Cans of beer per week  . Drug use: No  . Sexual activity: No   Other Topics Concern  . Not on file   Social History Narrative  . No narrative on file  Review of Systems  Constitutional: Negative for fever and weight loss.  HENT: Negative for ear discharge, ear pain, hearing loss and tinnitus.   Eyes: Negative for blurred vision, double vision, photophobia and pain.  Respiratory: Negative for cough and shortness of breath.   Cardiovascular: Negative for chest pain and palpitations.  Gastrointestinal: Negative for abdominal pain, blood in stool, constipation, diarrhea, heartburn, melena, nausea and vomiting.  Genitourinary: Negative for dysuria, flank pain, frequency, hematuria and urgency.  Musculoskeletal: Positive for joint pain. Negative for falls.  Neurological: Negative for dizziness, loss of consciousness and headaches.  Endo/Heme/Allergies: Negative for environmental allergies.  Psychiatric/Behavioral:  Negative for depression, hallucinations, substance abuse and suicidal ideas. The patient is nervous/anxious. The patient does not have insomnia.    BP 118/80   Pulse 75   Temp 98.5 F (36.9 C) (Oral)   Resp 14   Ht 5\' 7"  (1.702 m)   Wt 190 lb (86.2 kg)   SpO2 98%   BMI 29.76 kg/m   Physical Exam  Constitutional: He is oriented to person, place, and time and well-developed, well-nourished, and in no distress.  HENT:  Head: Normocephalic and atraumatic.  Right Ear: External ear normal.  Left Ear: External ear normal.  Nose: Nose normal.  Mouth/Throat: Oropharynx is clear and moist. No oropharyngeal exudate.  TM within normal limits bilaterally.  Eyes: Conjunctivae and EOM are normal. Pupils are equal, round, and reactive to light.  Neck: Neck supple. No thyromegaly present.  Cardiovascular: Normal rate, regular rhythm, normal heart sounds and intact distal pulses.   Pulmonary/Chest: Effort normal and breath sounds normal. No respiratory distress. He has no wheezes. He has no rales. He exhibits no tenderness.  Abdominal: Soft. Bowel sounds are normal. He exhibits no distension. There is no tenderness. There is no rebound and no guarding.  Lymphadenopathy:    He has no cervical adenopathy.  Neurological: He is alert and oriented to person, place, and time.  Skin: Skin is warm and dry. No rash noted.  Psychiatric: Affect normal.  Vitals reviewed.  Assessment/Plan: Hypertension BP normotensive today. Asymptomatic. Continue current regimen.  Hepatomegaly Noted on examination. LFTs today. US abdomen ordered. Discussed need for total alcohol cessation.  Anxiety and depression Stable overall. Recent stressors. Continue current regimen. CSC on file. UDS up-to-date. If any increased need for current medication, will add on a long-acting agent.   Visit for preventive health examination Depression screen negative. Health Maintenance reviewed. Preventive schedule discussed and handout  given in AVS. Will obtain fasting labs today.   Acute bilateral low back pain without sciatica Rx Mobic and Flexeril. Stretching exercises ad supportive measures reviewed. Will need imaging if not improving.     Piedad ClimesMartin, Charleene Callegari Cody, PA-C

## 2016-05-31 NOTE — Patient Instructions (Signed)
Please go to the lab for blood work.   Our office will call you with your results unless you have chosen to receive results via MyChart.  If your blood work is normal we will follow-up each year for physicals and as scheduled for chronic medical problems.  If anything is abnormal we will treat accordingly and get you in for a follow-up.  You will be contacted for an Ultrasound of your abdomen due to enlarge liver palpable on exam. Please cut back on the alcohol consumption. We will start Wellbutrin after we get labs and [place] results.  Follow-up in 3-4 weeks.  For the back pain, take Mobic once daily. Continue Flexeril. Limit heavy lifting and overexertion.  Will need imaging if not improving.    Preventive Care 40-64 Years, Male Preventive care refers to lifestyle choices and visits with your health care provider that can promote health and wellness. What does preventive care include?  A yearly physical exam. This is also called an annual well check.  Dental exams once or twice a year.  Routine eye exams. Ask your health care provider how often you should have your eyes checked.  Personal lifestyle choices, including:  Daily care of your teeth and gums.  Regular physical activity.  Eating a healthy diet.  Avoiding tobacco and drug use.  Limiting alcohol use.  Practicing safe sex.  Taking low-dose aspirin every day starting at age 75. What happens during an annual well check? The services and screenings done by your health care provider during your annual well check will depend on your age, overall health, lifestyle risk factors, and family history of disease. Counseling  Your health care provider may ask you questions about your:  Alcohol use.  Tobacco use.  Drug use.  Emotional well-being.  Home and relationship well-being.  Sexual activity.  Eating habits.  Work and work Statistician. Screening  You may have the following tests or measurements:  Height,  weight, and BMI.  Blood pressure.  Lipid and cholesterol levels. These may be checked every 5 years, or more frequently if you are over 3 years old.  Skin check.  Lung cancer screening. You may have this screening every year starting at age 15 if you have a 30-pack-year history of smoking and currently smoke or have quit within the past 15 years.  Fecal occult blood test (FOBT) of the stool. You may have this test every year starting at age 50.  Flexible sigmoidoscopy or colonoscopy. You may have a sigmoidoscopy every 5 years or a colonoscopy every 10 years starting at age 60.  Prostate cancer screening. Recommendations will vary depending on your family history and other risks.  Hepatitis C blood test.  Hepatitis B blood test.  Sexually transmitted disease (STD) testing.  Diabetes screening. This is done by checking your blood sugar (glucose) after you have not eaten for a while (fasting). You may have this done every 1-3 years. Discuss your test results, treatment options, and if necessary, the need for more tests with your health care provider. Vaccines  Your health care provider may recommend certain vaccines, such as:  Influenza vaccine. This is recommended every year.  Tetanus, diphtheria, and acellular pertussis (Tdap, Td) vaccine. You may need a Td booster every 10 years.  Varicella vaccine. You may need this if you have not been vaccinated.  Zoster vaccine. You may need this after age 50.  Measles, mumps, and rubella (MMR) vaccine. You may need at least one dose of MMR if you  were born in [birth year] or later. You may also need a second dose.  Pneumococcal 13-valent conjugate (PCV13) vaccine. You may need this if you have certain conditions and have not been vaccinated.  Pneumococcal polysaccharide (PPSV23) vaccine. You may need one or two doses if you smoke cigarettes or if you have certain conditions.  Meningococcal vaccine. You may need this if you have certain  conditions.  Hepatitis A vaccine. You may need this if you have certain conditions or if you travel or work in places where you may be exposed to hepatitis A.  Hepatitis B vaccine. You may need this if you have certain conditions or if you travel or work in places where you may be exposed to hepatitis B.  Haemophilus influenzae type b (Hib) vaccine. You may need this if you have certain risk factors. Talk to your health care provider about which screenings and vaccines you need and how often you need them. This information is not intended to replace advice given to you by your health care provider. Make sure you discuss any questions you have with your health care provider. Document Released: 01/27/2015 Document Revised: 09/20/2015 Document Reviewed: 11/01/2014 Elsevier Interactive Patient Education  2017 [employer]. .

## 2016-05-31 NOTE — Progress Notes (Signed)
Pre visit review using our clinic review tool, if applicable. No additional management support is needed unless otherwise documented below in the visit note. 

## 2016-06-04 ENCOUNTER — Other Ambulatory Visit: Payer: Self-pay | Admitting: Physician Assistant

## 2016-06-04 DIAGNOSIS — R972 Elevated prostate specific antigen [PSA]: Secondary | ICD-10-CM

## 2016-06-08 ENCOUNTER — Ambulatory Visit (HOSPITAL_BASED_OUTPATIENT_CLINIC_OR_DEPARTMENT_OTHER)
Admission: RE | Admit: 2016-06-08 | Discharge: 2016-06-08 | Disposition: A | Discharge status: Home / Self Care | Payer: 59 | Source: Ambulatory Visit | Attending: Physician Assistant | Admitting: Physician Assistant

## 2016-06-08 DIAGNOSIS — R160 Hepatomegaly, not elsewhere classified: Secondary | ICD-10-CM

## 2016-06-08 DIAGNOSIS — M545 Low back pain, unspecified: Secondary | ICD-10-CM | POA: Insufficient documentation

## 2016-06-08 NOTE — Assessment & Plan Note (Signed)
Stable overall. Recent stressors. Continue current regimen. CSC on file. UDS up-to-date. If any increased need for current medication, will add on a long-acting agent.

## 2016-06-08 NOTE — Assessment & Plan Note (Signed)
BP normotensive today. Asymptomatic.Continue current regimen. 

## 2016-06-08 NOTE — Assessment & Plan Note (Signed)
Noted on examination. LFTs today. US abdomen ordered. Discussed need for total alcohol cessation.

## 2016-06-08 NOTE — Assessment & Plan Note (Signed)
Depression screen negative. Health Maintenance reviewed. Preventive schedule discussed and handout given in AVS. Will obtain fasting labs today.  

## 2016-06-08 NOTE — Assessment & Plan Note (Signed)
Rx Mobic and Flexeril. Stretching exercises ad supportive measures reviewed. Will need imaging if not improving.

## 2016-07-02 ENCOUNTER — Other Ambulatory Visit: Payer: Self-pay | Admitting: Physician Assistant

## 2016-07-02 NOTE — Telephone Encounter (Signed)
Xanax last rx 05/13/16 #60 UDS: 03/22/16 CSC: 09/12/14

## 2016-07-02 NOTE — Telephone Encounter (Signed)
Patient requesting refill of:   ALPRAZolam (XANAX) 0.5 MG tablet  metoprolol succinate (TOPROL-XL) 25 MG 24 hr tablet  Pharmacy:  [facility] [id number] - [place], [place] - [address] [phone] (Phone) [phone] (Fax)

## 2016-07-03 MED ORDER — ALPRAZOLAM 0.5 MG PO TABS: 0.5000 mg | ORAL_TABLET | Freq: Two times a day (BID) | ORAL | 0 refills | Status: DC | PRN

## 2016-07-03 MED ORDER — METOPROLOL SUCCINATE ER 25 MG PO TB24: 25.0000 mg | ORAL_TABLET | Freq: Every day | ORAL | 5 refills | Status: DC

## 2016-07-03 NOTE — Telephone Encounter (Signed)
Rx for Xanax faxed to the pharmacy

## 2016-07-03 NOTE — Telephone Encounter (Signed)
Metoprolol sent to pharmacy. Rx alprazolam printed. Ok to fax. CSC and UDS up-to-date.

## 2016-07-08 ENCOUNTER — Other Ambulatory Visit: Payer: Self-pay | Admitting: Physician Assistant

## 2016-08-26 ENCOUNTER — Other Ambulatory Visit: Payer: Self-pay | Admitting: Physician Assistant

## 2016-08-26 NOTE — Telephone Encounter (Signed)
Xanax last rx 07/03/16 #60 CSC: 09/12/14 UDS: 03/22/16 moderate risk Last OV: 05/31/16  Please advise

## 2016-08-27 NOTE — Telephone Encounter (Signed)
Rx faxed to the [facility] pharmacy 

## 2016-10-21 ENCOUNTER — Other Ambulatory Visit: Payer: Self-pay | Admitting: Physician Assistant

## 2016-10-21 DIAGNOSIS — F411 Generalized anxiety disorder: Secondary | ICD-10-CM

## 2016-10-21 NOTE — Telephone Encounter (Signed)
Last OV 05/31/16 Alprazolam last filled 08/27/16 #60 with 0  CSC on file, UDS moderate risk due for screen

## 2016-10-21 NOTE — Telephone Encounter (Signed)
Spoke with patient and advised of rx is ready for pick up and to give a UDS. Patient is agreeable.

## 2016-10-21 NOTE — Telephone Encounter (Signed)
Rx printed. Needs UDS at time of pickup. UDS ordered

## 2016-10-25 ENCOUNTER — Other Ambulatory Visit: Payer: 59

## 2016-10-25 DIAGNOSIS — F411 Generalized anxiety disorder: Secondary | ICD-10-CM

## 2016-10-25 MED FILL — ALPRAZolam 0.5 MG TABS: 0.5 | 30 days supply | Qty: 60 | Fill #0

## 2016-10-29 LAB — PAIN MGMT, PROFILE 8 W/CONF, U
6 Acetylmorphine: NEGATIVE ng/mL (ref <10)
ALPHAHYDROXYALPRAZOLAM: NEGATIVE ng/mL (ref <25)
ALPHAHYDROXYMIDAZOLAM: NEGATIVE ng/mL (ref <50)
ALPHAHYDROXYTRIAZOLAM: NEGATIVE ng/mL (ref <50)
Alcohol Metabolites: NEGATIVE ng/mL (ref <500)
Aminoclonazepam: NEGATIVE ng/mL (ref <25)
Amphetamines: NEGATIVE ng/mL (ref <500)
BENZODIAZEPINES: NEGATIVE ng/mL (ref <100)
BUPRENORPHINE, URINE: NEGATIVE ng/mL (ref <5)
CREATININE: 45.6 mg/dL
Cocaine Metabolite: NEGATIVE ng/mL (ref <150)
HYDROXYETHYLFLURAZEPAM: NEGATIVE ng/mL (ref <50)
Lorazepam: NEGATIVE ng/mL (ref <50)
MARIJUANA METABOLITE: 15 ng/mL — AB (ref <5)
MDMA: NEGATIVE ng/mL (ref <500)
Marijuana Metabolite: POSITIVE ng/mL — AB (ref <20)
Nordiazepam: NEGATIVE ng/mL (ref <50)
OXAZEPAM: NEGATIVE ng/mL (ref <50)
OXIDANT: NEGATIVE ug/mL (ref <200)
OXYCODONE: NEGATIVE ng/mL (ref <100)
Opiates: NEGATIVE ng/mL (ref <100)
PH: 6.79 (ref 4.5–9.0)
TEMAZEPAM: NEGATIVE ng/mL (ref <50)

## 2016-12-02 ENCOUNTER — Ambulatory Visit: Payer: Self-pay | Admitting: *Deleted

## 2016-12-02 NOTE — Telephone Encounter (Signed)
pt states he started feeling funny the morning after he took the first dose of medication; pt states he also feels axnious; BP is 177/119; pt says that the medication is a version of flomax but he is not sure of the MDs name or the date that it was prescribed; Contactted Jerrel IvoryYafi Ani, Pharmacist at [facility], [place] who confirmed that the prescription was for generic flomax 0.4 mg 1 capsule nightly at bedtime and was written on 11/25/16 by Dr Wilkie AyePatrick McKenzie; Charna ElizabethYani also explained that the patient had come into pharmacy and had BP checked but had smoked app 30 min prior to coming in; she further instructed him to come back tomorrow and recheck BP but do not smoke before coming in; pt not sure if he should see PCP or go back to have BP recheck at pharmacy tomorrow; pt says that he does not want to spend additional money if it is not necessary; Pt also states that he checks his BP daily and that this is the only elevated reading that he has had; pt later later states that he will to back to have BP recheck tomorrow, and will schedule MD appointment at that time if needed; will also route to [facility] to make them aware of events; this pt was last seen by Marcelline MatesWilliam Martin in May 2018.  Reason for Disposition . Caller has medication question about med not prescribed by PCP and triager unable to answer question (e.g., compatibility with other med, storage)  Answer Assessment - Initial Assessment Questions 1. SYMPTOMS: "Do you have any symptoms?"     Blood pressure elevated 2. SEVERITY: If symptoms are present, ask "Are they mild, moderate or severe?"  unable to rate  Protocols used: MEDICATION QUESTION CALL-A-AH

## 2016-12-02 NOTE — Telephone Encounter (Signed)
If patient is still symptomatic and BP remains elevated he needs ER assessment.

## 2016-12-02 NOTE — Telephone Encounter (Signed)
Patient has been advised of PCP notes.  He is going to call the Dr that gave him the medication as well.  He is aware that for the symptoms, and BP elevated that he needs ER evaluation.   Patient stated verbal understanding.

## 2016-12-29 ENCOUNTER — Other Ambulatory Visit: Payer: Self-pay | Admitting: Physician Assistant

## 2017-01-03 ENCOUNTER — Ambulatory Visit (INDEPENDENT_AMBULATORY_CARE_PROVIDER_SITE_OTHER): Payer: 59 | Admitting: Physician Assistant

## 2017-01-03 ENCOUNTER — Encounter: Payer: Self-pay | Admitting: Physician Assistant

## 2017-01-03 ENCOUNTER — Other Ambulatory Visit: Payer: Self-pay

## 2017-01-03 ENCOUNTER — Ambulatory Visit: Payer: 59 | Admitting: Physician Assistant

## 2017-01-03 VITALS — BP 124/85 | HR 79 | Temp 98.1°F | Resp 16 | Ht 68.0 in | Wt 186.0 lb

## 2017-01-03 DIAGNOSIS — M5412 Radiculopathy, cervical region: Secondary | ICD-10-CM

## 2017-01-03 DIAGNOSIS — G629 Polyneuropathy, unspecified: Secondary | ICD-10-CM | POA: Diagnosis not present

## 2017-01-03 DIAGNOSIS — I10 Essential (primary) hypertension: Secondary | ICD-10-CM

## 2017-01-03 DIAGNOSIS — R0982 Postnasal drip: Secondary | ICD-10-CM | POA: Diagnosis not present

## 2017-01-03 MED ORDER — GABAPENTIN 100 MG PO CAPS: 100.0000 mg | ORAL_CAPSULE | Freq: Three times a day (TID) | ORAL | 3 refills | Status: DC

## 2017-01-03 MED ORDER — LEVOCETIRIZINE DIHYDROCHLORIDE 5 MG PO TABS: 5.0000 mg | ORAL_TABLET | Freq: Every evening | ORAL | 2 refills | Status: DC

## 2017-01-03 MED ORDER — FLUTICASONE PROPIONATE 50 MCG/ACT NA SUSP: 2.0000 | Freq: Every day | NASAL | 6 refills | Status: AC

## 2017-01-03 NOTE — Patient Instructions (Signed)
For nerve pain -- we are checking labs today. Start the Gabapentin taking twice daily for 1 week. Then increase to three times daily. This medicine will likely help anxiety as well. Contact your insurance representative regarding physical therapy as discussed. You should also follow-up with your neurosurgeon.  BP looks great today. Continue current regimen.

## 2017-01-04 LAB — COMPREHENSIVE METABOLIC PANEL
AG Ratio: 1.8 (calc) (ref 1.0–2.5)
ALBUMIN MSPROF: 4.3 g/dL (ref 3.6–5.1)
ALT: 24 U/L (ref 9–46)
AST: 19 U/L (ref 10–35)
Alkaline phosphatase (APISO): 58 U/L (ref 40–115)
BUN: 12 mg/dL (ref 7–25)
CO2: 25 mmol/L (ref 20–32)
CREATININE: 1 mg/dL (ref 0.70–1.25)
Calcium: 9.4 mg/dL (ref 8.6–10.3)
Chloride: 102 mmol/L (ref 98–110)
Globulin: 2.4 g/dL (calc) (ref 1.9–3.7)
Glucose, Bld: 88 mg/dL (ref 65–99)
POTASSIUM: 4.2 mmol/L (ref 3.5–5.3)
SODIUM: 137 mmol/L (ref 135–146)
TOTAL PROTEIN: 6.7 g/dL (ref 6.1–8.1)
Total Bilirubin: 0.8 mg/dL (ref 0.2–1.2)

## 2017-01-04 LAB — EXTRA LAV TOP TUBE

## 2017-01-04 LAB — B12 AND FOLATE PANEL
FOLATE: 10.8 ng/mL
VITAMIN B 12: 785 pg/mL (ref 200–1100)

## 2017-01-05 NOTE — Progress Notes (Signed)
Patient presents to clinic today for follow-up of hypertension. Patient also with acute concern today.   Hypertension -- Patient is currently on a regimen of Toprol XL 25 mg daily. Is taking medications as directed. Denies side effects of medication. Patient denies chest pain, palpitations, lightheadedness, dizziness, vision changes or frequent headaches.  BP Readings from Last 3 Encounters:  01/03/17 124/85  05/31/16 118/80  03/22/16 120/84   Patient with history of cervical disc disease, followed by Neurosurgery. Surgical intervention was recommended by specialist but patient did not want to proceed. PT recommended but patient states his prior insurance plan would not cover. Is still having intermittent neck pain with occasional tingling in arms and hands. Denies weakness or decreased ROM. Is wanting to discuss treatment options.   Past Medical History:  Diagnosis Date  . Alcoholism /alcohol abuse ([place])   . Anxiety and depression   . Colon polyps   . Hepatitis B 1977  . Hyperlipidemia   . Hypertension     Current Outpatient Medications on File Prior to Visit  Medication Sig Dispense Refill  . cyclobenzaprine (FLEXERIL) 10 MG tablet Take 1 tablet (10 mg total) by mouth 3 (three) times daily as needed for muscle spasms. 30 tablet 3  . metoprolol succinate (TOPROL-XL) 25 MG 24 hr tablet TAKE 1 TABLET(25 MG) BY MOUTH DAILY 90 tablet 1  . alfuzosin (UROXATRAL) 10 MG 24 hr tablet   0  . ALPRAZolam (XANAX) 0.5 MG tablet TAKE 1 TABLET BY MOUTH TWICE DAILY AS NEEDED FOR ANXIETY (Patient not taking: Reported on 01/03/2017) 60 tablet 0   No current facility-administered medications on file prior to visit.     Allergies  Allergen Reactions  . Citalopram Other (See Comments)    Severe anxiety, tearfulness, thoughts of harm.   . Sulfa Antibiotics     Family History  Problem Relation Age of Onset  . Cancer Father 70       Stage IV Liver Cancer with mets to Colon  . Aneurysm Mother          Heart  . Hyperlipidemia Mother   . Hypertension Mother   . Epilepsy Brother   . Healthy Brother        x1  . Healthy Son        x2    Social History   Socioeconomic History  . Marital status: Married    Spouse name: None  . Number of children: None  . Years of education: None  . Highest education level: None  Social Needs  . Financial resource strain: None  . Food insecurity - worry: None  . Food insecurity - inability: None  . Transportation needs - medical: None  . Transportation needs - non-medical: None  Occupational History  . None  Tobacco Use  . Smoking status: Current Every Day Smoker    Packs/day: 1.50    Years: 30.00    Pack years: 45.00    Types: Cigarettes  . Smokeless tobacco: Never Used  Substance and Sexual Activity  . Alcohol use: Yes    Alcohol/week: 6.0 oz    Types: 10 Cans of beer per week  . Drug use: No  . Sexual activity: No  Other Topics Concern  . None  Social History Narrative  . None    Review of Systems - See HPI.  All other ROS are negative.  BP 124/85   Pulse 79   Temp 98.1 F (36.7 C) (Oral)   Resp 16  Ht _0  (1.727 m)   Wt 186 lb (84.4 kg)   SpO2 98%   BMI 28.28 kg/m   Physical Exam  Constitutional: He is oriented to person, place, and time and well-developed, well-nourished, and in no distress.  HENT:  Head: Normocephalic and atraumatic.  Right Ear: External ear normal.  Left Ear: External ear normal.  Nose: Mucosal edema and rhinorrhea present. Right sinus exhibits no maxillary sinus tenderness and no frontal sinus tenderness. Left sinus exhibits no maxillary sinus tenderness and no frontal sinus tenderness.  Mouth/Throat: Oropharynx is clear and moist.  Eyes: Conjunctivae are normal.  Cardiovascular: Normal rate, regular rhythm, normal heart sounds and intact distal pulses.  Pulmonary/Chest: Effort normal and breath sounds normal. No respiratory distress. He has no wheezes. He has no rales. He exhibits no  tenderness.  Neurological: He is alert and oriented to person, place, and time. No cranial nerve deficit.  Sensory exam within normal limits  Skin: Skin is warm and dry. No rash noted.  Psychiatric: Affect normal.  Vitals reviewed.  Recent Results (from the past 2160 hour(s))  Pain Mgmt, Profile 8 w/Conf, U     Status: Abnormal   Collection Time: 10/25/16 10:31 AM  Result Value Ref Range   Creatinine 45.6 > or = 20. mg/dL   pH 6.79 4.5 - 9.0   Oxidant NEGATIVE <200 mcg/mL   Amphetamines NEGATIVE <500 ng/mL   medMATCH Amphetamines CONSISTENT    Benzodiazepines NEGATIVE CONFIRMED <100 ng/mL   Alphahydroxyalprazolam NEGATIVE <25 ng/mL    Comment: See Note 1   medMATCH aOH alprazolam CONSISTENT    Alphahydroxymidazolam NEGATIVE <50 ng/mL    Comment: See Note 1   medMATCH aOH midazolam CONSISTENT    Alphahydroxytriazolam NEGATIVE <50 ng/mL    Comment: See Note 1   medMATCH aOH triazolam CONSISTENT    Aminoclonazepam NEGATIVE <25 ng/mL    Comment: See Note 1   medMATCH Aminoclonazepam CONSISTENT    Hydroxyethylflurazepam NEGATIVE <50 ng/mL    Comment: See Note 1   medMATCH OH,Et flurazepam CONSISTENT    Lorazepam NEGATIVE <50 ng/mL    Comment: See Note 1   medMATCH Lorazepam CONSISTENT    Nordiazepam NEGATIVE <50 ng/mL    Comment: See Note 1   medMATCH Nordiazepam CONSISTENT    Oxazepam NEGATIVE <50 ng/mL    Comment: See Note 1   medMATCH Oxazepam CONSISTENT    Temazepam NEGATIVE <50 ng/mL    Comment: See Note 1   medMATCH Temazepam CONSISTENT    Marijuana Metabolite POSITIVE (A) <20 ng/mL   Marijuana Metabolite 15 (H) <5 ng/mL    Comment: See Note 1   medMATCH Marijuana Metab INCONSISTENT    Cocaine Metabolite NEGATIVE <150 ng/mL   medMATCH Cocaine Metab CONSISTENT    Opiates NEGATIVE <100 ng/mL   medMATCH Opiates CONSISTENT    Oxycodone NEGATIVE <100 ng/mL   medMATCH Oxycodone CONSISTENT     Comment: Note 1 . This test was developed and its analytical performance    characteristics have been determined by [employer]. It has not been cleared or approved by the FDA. This assay has been validated pursuant to the CLIA  regulations and is used for clinical purposes.    Buprenorphine, Urine NEGATIVE <5 ng/mL   medMATCH Buprenorphine CONSISTENT    MDMA NEGATIVE <500 ng/mL   [facility] MDMA CONSISTENT    Alcohol Metabolites NEGATIVE <500 ng/mL   [facility] Alcohol Metab CONSISTENT    6 Acetylmorphine NEGATIVE <10 ng/mL   [facility]  6 Acetylmorphine CONSISTENT     Comment: This drug testing is for medical treatment only.   Analysis was performed as non-forensic testing and  these results should be used only by healthcare  providers to render diagnosis or treatment, or to  monitor progress of medical conditions. Hazel Sams comments are:  - present when drug test results may be the result of     metabolism of one or more drugs or when results are     inconsistent with prescribed medication(s) listed.  - may be blank when drug results are consistent with     prescribed medication(s) listed. . For assistance with interpreting these drug results,  please contact a [employer] Toxicology  Specialist: [phone] [place] [phone]), M-F,  8am-6pm EST. This drug testing is for medical treatment only.   Analysis was performed as non-forensic testing and  these results should be used only by healthcare  providers to render diagnosis or treatment, or to  monitor progress of medical conditions. Hazel Sams comments are:  - present when  drug test results may be the result of     metabolism of one or more drugs or when results are     inconsistent with prescribed medication(s) listed.  - may be blank when drug results are consistent with     prescribed medication(s) listed. . For assistance with interpreting these drug results,  please contact a [employer] Toxicology  Specialist: [phone] [place] [phone]), M-F,  8am-6pm  EST. This drug testing is for medical treatment only.   Analysis was performed as non-forensic testing and  these results should be used only by healthcare  providers to render diagnosis or treatment, or to  monitor progress of medical conditions. Hazel Sams comments are:  - present when drug test results may be the result of     metabolism of one or more drugs or when results are     inconsistent with prescribed medication(s) listed.  - may be blank when drug results are consistent with     prescribed medication(s) listed. . For assistance with interpreting these d rug results,  please contact a [employer] Toxicology  Specialist: [phone] [place] [phone]), M-F,  8am-6pm EST. This drug testing is for medical treatment only.   Analysis was performed as non-forensic testing and  these results should be used only by healthcare  providers to render diagnosis or treatment, or to  monitor progress of medical conditions. Hazel Sams comments are:  - present when drug test results may be the result of     metabolism of one or more drugs or when results are     inconsistent with prescribed medication(s) listed.  - may be blank when drug results are consistent with     prescribed medication(s) listed. . For assistance with interpreting these drug results,  please contact a [employer] Toxicology  Specialist: [phone] [place] [phone]), M-F,  8am-6pm EST. This drug testing is for medical treatment only.   Analysis was performed as non-forensic testing and  these results should be used only by healthcare  provi ders to render diagnosis or treatment, or to  monitor progress of medical conditions. Hazel Sams comments are:  - present when drug test results may be the result of     metabolism of one or more drugs or when results are     inconsistent with prescribed medication(s) listed.  - may be blank when drug results are consistent with     prescribed  medication(s) listed. Marland Kitchen  For assistance with interpreting these drug results,  please contact a [employer] Toxicology  Specialist: [phone] [place] [phone]), M-F,  8am-6pm EST.   B12 and Folate Panel     Status: None   Collection Time: 01/03/17  4:17 PM  Result Value Ref Range   Vitamin B-12 785 200 - 1,100 pg/mL   Folate 10.8 ng/mL    Comment:                            Reference Range                            Low:           <3.4                            Borderline:    3.4-5.4                            Normal:        >5.4 .   Comp Met (CMET)     Status: None   Collection Time: 01/03/17  4:17 PM  Result Value Ref Range   Glucose, Bld 88 65 - 99 mg/dL    Comment: .            Fasting reference interval .    BUN 12 7 - 25 mg/dL   Creat 1.00 0.70 - 1.25 mg/dL    Comment: For patients >57 years of age, the reference limit for Creatinine is approximately 13% higher for people identified as African-American. .    BUN/Creatinine Ratio NOT APPLICABLE 6 - 22 (calc)   Sodium 137 135 - 146 mmol/L   Potassium 4.2 3.5 - 5.3 mmol/L   Chloride 102 98 - 110 mmol/L   CO2 25 20 - 32 mmol/L   Calcium 9.4 8.6 - 10.3 mg/dL   Total Protein 6.7 6.1 - 8.1 g/dL   Albumin 4.3 3.6 - 5.1 g/dL   Globulin 2.4 1.9 - 3.7 g/dL (calc)   AG Ratio 1.8 1.0 - 2.5 (calc)   Total Bilirubin 0.8 0.2 - 1.2 mg/dL   Alkaline phosphatase (APISO) 58 40 - 115 U/L   AST 19 10 - 35 U/L   ALT 24 9 - 46 U/L  EXTRA LAV TOP TUBE     Status: None   Collection Time: 01/03/17  4:17 PM  Result Value Ref Range   EXTRA LAVENDER-TOP TUBE      Comment: We received an extra specimen with no test requested. If any test is desired for this specimen please call client services and advise.     Assessment/Plan: 1. Post-nasal drip Start Flonase and Xyzal. Supportive measures reviewed with patient. Follow-up if not improving.  - fluticasone (FLONASE) 50 MCG/ACT nasal spray; Place 2 sprays into both nostrils  daily.  Dispense: 16 g; Refill: 6 - levocetirizine (XYZAL) 5 MG tablet; Take 1 tablet (5 mg total) by mouth every evening.  Dispense: 30 tablet; Refill: 2 - B12 and Folate Panel - Comp Met (CMET)  2. Neuropathy Will check b12 and folate to rule out causes of neuropathy. Suspect symptoms are related from cervical neck issues. Wills tart Gabapentin. - gabapentin (NEURONTIN) 100 MG capsule; Take 1 capsule (100 mg total) by mouth 3 (three) times daily.  Dispense: 90 capsule; Refill: 3 -  B12 and Folate Panel - Comp Met (CMET)  3. Cervical radiculopathy Start Gabapentin as directed for pain. Patient to check with insurance regarding PT. Encouraged to reconsider Neurosurgeries recommendations as this is likely to worsen.  - gabapentin (NEURONTIN) 100 MG capsule; Take 1 capsule (100 mg total) by mouth 3 (three) times daily.  Dispense: 90 capsule; Refill: 3 - B12 and Folate Panel - Comp Met (CMET)  4. Essential hypertension Stable. Asymptomatic. Continue current regimen.    Leeanne Rio, PA-C

## 2017-01-06 ENCOUNTER — Telehealth: Payer: Self-pay | Admitting: Physician Assistant

## 2017-01-06 NOTE — Telephone Encounter (Signed)
Notes have been copied over to results and routed to pcp

## 2017-01-06 NOTE — Telephone Encounter (Signed)
Pt given per Selena Battenody MartinLabs look good. B12 and folate are normal. How is he tolerating the Gabapentin? Is it helping with nerve pain at current dose? If not we can increase to 100 mg AM, 100 mg noon, and 300 mg each evening. Has he checked with insurance regarding physical therapy?; pt states that he has not started taking his gabapentin yet; he also says that he will take his remaining xanax before he starts any new medication; will  [facility] for notification of this encounter.

## 2017-05-16 ENCOUNTER — Other Ambulatory Visit: Payer: Self-pay

## 2017-05-16 ENCOUNTER — Ambulatory Visit: Payer: 59 | Admitting: Physician Assistant

## 2017-05-16 ENCOUNTER — Encounter: Payer: Self-pay | Admitting: Physician Assistant

## 2017-05-16 ENCOUNTER — Ambulatory Visit (INDEPENDENT_AMBULATORY_CARE_PROVIDER_SITE_OTHER): Payer: 59

## 2017-05-16 VITALS — BP 138/84 | HR 82 | Temp 98.3°F | Resp 14 | Ht 68.0 in | Wt 187.0 lb

## 2017-05-16 DIAGNOSIS — M545 Low back pain, unspecified: Secondary | ICD-10-CM

## 2017-05-16 DIAGNOSIS — M792 Neuralgia and neuritis, unspecified: Secondary | ICD-10-CM | POA: Diagnosis not present

## 2017-05-16 MED ORDER — PREGABALIN 50 MG PO CAPS: 50.0000 mg | ORAL_CAPSULE | Freq: Two times a day (BID) | ORAL | 0 refills | Status: DC

## 2017-05-16 NOTE — Patient Instructions (Signed)
Please limit heavy lifting and get plenty of rest.  Start the Lyrica twice daily as directed, make sure to use the voucher card given.  Please use tylenol for breakthrough pain.  If x-ray shows concerning findings, we may have to proceed with MRI or Neurology assessment.   We will alter regimen once results are in.

## 2017-05-16 NOTE — Progress Notes (Signed)
Patient presents to clinic today c/o continued burning and tingling sensation in his feet/legs bilaterally. Patient with previous workup for neuropathy including B12, folate, TSH and glucose levels all within normal limits. Patient started on Gabapentin 100 mg TID but was unable to tolerate more than once per day. As such is not taking currently. Is not taking anything OTC. Patient has noted occasional burning in the buttock region with some lower back pain. Denies trauma or injury. Denies weakness of lower extremity. Denies saddle anesthesia or change to bowel/bladder habits.   Past Medical History:  Diagnosis Date  . Alcoholism /alcohol abuse ([facility])   . Anxiety and depression   . Colon polyps   . Hepatitis B 1977  . Hyperlipidemia   . Hypertension     Current Outpatient Medications on File Prior to Visit  Medication Sig Dispense Refill  . cyclobenzaprine (FLEXERIL) 10 MG tablet Take 1 tablet (10 mg total) by mouth 3 (three) times daily as needed for muscle spasms. 30 tablet 3  . fluticasone (FLONASE) 50 MCG/ACT nasal spray Place 2 sprays into both nostrils daily. 16 g 6  . metoprolol succinate (TOPROL-XL) 25 MG 24 hr tablet TAKE 1 TABLET(25 MG) BY MOUTH DAILY 90 tablet 1  . gabapentin (NEURONTIN) 100 MG capsule Take 1 capsule (100 mg total) by mouth 3 (three) times daily. (Patient not taking: Reported on 05/16/2017) 90 capsule 3   No current facility-administered medications on file prior to visit.     Allergies  Allergen Reactions  . Citalopram Other (See Comments)    Severe anxiety, tearfulness, thoughts of harm.   . Sulfa Antibiotics     Family History  Problem Relation Age of Onset  . Cancer Father 68       Stage IV Liver Cancer with mets to Colon  . Aneurysm Mother        Heart  . Hyperlipidemia Mother   . Hypertension Mother   . Epilepsy Brother   . Healthy Brother        x1  . Healthy Son        x2    Social History   Socioeconomic History  . Marital status:  Married    Spouse name: Not on file  . Number of children: Not on file  . Years of education: Not on file  . Highest education level: Not on file  Occupational History  . Not on file  Social Needs  . Financial resource strain: Not on file  . Food insecurity:    Worry: Not on file    Inability: Not on file  . Transportation needs:    Medical: Not on file    Non-medical: Not on file  Tobacco Use  . Smoking status: Current Every Day Smoker    Packs/day: 1.50    Years: 30.00    Pack years: 45.00    Types: Cigarettes  . Smokeless tobacco: Never Used  Substance and Sexual Activity  . Alcohol use: Yes    Alcohol/week: 6.0 oz    Types: 10 Cans of beer per week  . Drug use: No  . Sexual activity: Never  Lifestyle  . Physical activity:    Days per week: Not on file    Minutes per session: Not on file  . Stress: Not on file  Relationships  . Social connections:    Talks on phone: Not on file    Gets together: Not on file    Attends religious service: Not on file  Active member of club or organization: Not on file    Attends meetings of clubs or organizations: Not on file    Relationship status: Not on file  Other Topics Concern  . Not on file  Social History Narrative  . Not on file   Review of Systems - See HPI.  All other ROS are negative.  BP 138/84   Pulse 82   Temp 98.3 F (36.8 C) (Oral)   Resp 14   Ht  (1.727 m)   Wt 187 lb (84.8 kg)   SpO2 96%   BMI 28.43 kg/m   Physical Exam  Constitutional: He is oriented to person, place, and time. He appears well-developed and well-nourished.  HENT:  Head: Normocephalic and atraumatic.  Eyes: Conjunctivae are normal.  Cardiovascular: Normal rate, regular rhythm, normal heart sounds and intact distal pulses.  Pulmonary/Chest: Effort normal and breath sounds normal.  Musculoskeletal:       Lumbar back: He exhibits tenderness and pain. He exhibits normal range of motion and no bony tenderness.       Right foot:  Normal.       Left foot: Normal.  Neurological: He is alert and oriented to person, place, and time.  Skin: Skin is warm and dry. No rash noted.  Psychiatric: He has a normal mood and affect.  Vitals reviewed.  Assessment/Plan: 1. Lumbar pain 2. Neuropathic pain Will proceed with x-ray of lumbar spine. Will likely need OP MRI following this. Start trial of Lyrica 50 mg BID. Will alter regimen based on results and tolerability/response to Lyrica. - DG Lumbar Spine Complete; Future - pregabalin (LYRICA) 50 MG capsule; Take 1 capsule (50 mg total) by mouth 2 (two) times daily.  Dispense: 28 capsule; Refill: 0   Piedad Climes, PA-C

## 2017-05-20 ENCOUNTER — Telehealth: Payer: Self-pay | Admitting: Physician Assistant

## 2017-05-20 NOTE — Telephone Encounter (Signed)
Pt reported that he has stopped the Gabapentin and wanted to know if he needed to be put on a different medication.

## 2017-05-20 NOTE — Telephone Encounter (Signed)
Copied from CRM [id number]. Topic: Quick Communication - Lab Results >> May 20, 2017  1:05 PM Con Memos, CMA wrote: Called patient to inform them of xray results. When patient returns call, triage nurse may disclose results.

## 2017-05-21 ENCOUNTER — Other Ambulatory Visit: Payer: Self-pay | Admitting: Physician Assistant

## 2017-05-21 ENCOUNTER — Telehealth: Payer: Self-pay | Admitting: Emergency Medicine

## 2017-05-21 DIAGNOSIS — M5136 Other intervertebral disc degeneration, lumbar region: Secondary | ICD-10-CM

## 2017-05-21 MED ORDER — PREGABALIN 75 MG PO CAPS: 75.0000 mg | ORAL_CAPSULE | Freq: Two times a day (BID) | ORAL | 1 refills | Status: AC

## 2017-05-21 NOTE — Telephone Encounter (Signed)
Patient notified of results by office- 5/8 9:25

## 2017-05-21 NOTE — Telephone Encounter (Signed)
[id number] started for Lyrica 75 mg bid. He has failed Gabapentin and Cymbalta. Approved from 05/21/2017-05/22/2018 Notified patient pharmacy of approval

## 2017-05-21 NOTE — Telephone Encounter (Signed)
PCP aware and documentation in his lab results

## 2017-05-24 ENCOUNTER — Ambulatory Visit (HOSPITAL_BASED_OUTPATIENT_CLINIC_OR_DEPARTMENT_OTHER)
Admission: RE | Admit: 2017-05-24 | Discharge: 2017-05-24 | Disposition: A | Discharge status: Home / Self Care | Payer: 59 | Source: Ambulatory Visit | Attending: Physician Assistant | Admitting: Physician Assistant

## 2017-05-24 DIAGNOSIS — M5126 Other intervertebral disc displacement, lumbar region: Secondary | ICD-10-CM | POA: Diagnosis not present

## 2017-05-24 DIAGNOSIS — M5136 Other intervertebral disc degeneration, lumbar region: Secondary | ICD-10-CM | POA: Diagnosis not present

## 2017-05-27 ENCOUNTER — Telehealth: Payer: Self-pay | Admitting: Physician Assistant

## 2017-05-27 ENCOUNTER — Other Ambulatory Visit: Payer: Self-pay

## 2017-05-27 DIAGNOSIS — M545 Low back pain, unspecified: Secondary | ICD-10-CM

## 2017-05-27 DIAGNOSIS — M792 Neuralgia and neuritis, unspecified: Secondary | ICD-10-CM

## 2017-05-27 NOTE — Telephone Encounter (Signed)
Pt.'s co-pay for the Lyrica 75 mg is $200. Does the office have any more coupons or anything to help the pt. With the cost?

## 2017-05-27 NOTE — Telephone Encounter (Signed)
Copied from CRM [id number]. Topic: Quick Communication - Rx Refill/Question >> May 27, 2017 11:10 AM Raquel Sarna wrote:  pregabalin (LYRICA) 75 MG capsule   Pt is needing the new dosage amount for 1st prescription be called in asap.  [id number] - [place], [place] - [address] AT [facility] [address] [place] [address] Phone: [phone] Fax: [phone]

## 2017-05-27 NOTE — Telephone Encounter (Signed)
The coupon cards that we have in the office are a 1 time use only trial.  I looked at the Lyrica website and they do have coupon cards available for patients. I called and let patient know that he can get them from the website.

## 2017-05-30 ENCOUNTER — Telehealth: Payer: Self-pay | Admitting: Emergency Medicine

## 2017-05-30 NOTE — Telephone Encounter (Signed)
Copied from CRM [id number]. Topic: General - Other >> May 29, 2017  9:19 AM Leafy Ro wrote: Reason for CRM: pt is calling and requesting a nurse to call him back today concerning his back pain. Pt saw cody martin on 05-16-17

## 2017-05-30 NOTE — Telephone Encounter (Addendum)
Pt states he does not have a a printer. Would like to know if you could print there at the office and he will pick up when he can. In the meantime, pt would like to know if Selena Batten will prescribe   methocarbamol (ROBAXIN) 500 MG tablet   to help get through this time until he can get to the office to pick up this coupon. (pt states he cannot work on the flexeril, and this muscle relaxer helped in the past)  [facility] [id number] - [place], [place] - [address] AT [facility] [address] [phone] (Phone) [phone] (Fax)

## 2017-06-25 ENCOUNTER — Telehealth: Payer: Self-pay | Admitting: Emergency Medicine

## 2017-06-25 DIAGNOSIS — M545 Low back pain, unspecified: Secondary | ICD-10-CM

## 2017-06-25 DIAGNOSIS — M5010 Cervical disc disorder with radiculopathy, unspecified cervical region: Secondary | ICD-10-CM

## 2017-06-25 NOTE — Telephone Encounter (Signed)
Spoke with patient about referral to Dr Ollen BowlHarkins. Patient states he was contacted for an appointment with Dr Mikal Planeabell but he didn't want to see him.  I thought I spoke with our scheduler about referring to the requested provider. He states has seen another provider for his pain control. In reviewing his chart, he saw [facility] provider. Looks like has transferred care. He did not see another Neurosurgery specialist.

## 2017-06-25 NOTE — Telephone Encounter (Signed)
Copied from CRM [id number]. Topic: Referral - Request >> May 29, 2017  9:18 AM Leafy Roobinson, Norma J wrote: Reason for CRM:pt would like to see dr Odette Fractionpaul harkins neurosurgery >> Jun 03, 2017  8:47 AM Con MemosMoore, Ebonee Stober S, CMA wrote: Advised the referral rep to update patient referral with preferred specialist.

## 2017-07-01 ENCOUNTER — Other Ambulatory Visit: Payer: Self-pay | Admitting: Physician Assistant

## 2019-06-16 IMAGING — DX DG LUMBAR SPINE COMPLETE 4+V
5 series · 5 of 5 positions shown · non-contrast
Comparison: None.

CLINICAL DATA: Low back pain for several weeks

EXAM:
LUMBAR SPINE - COMPLETE 4+ VIEW

[lumbar spine ap]
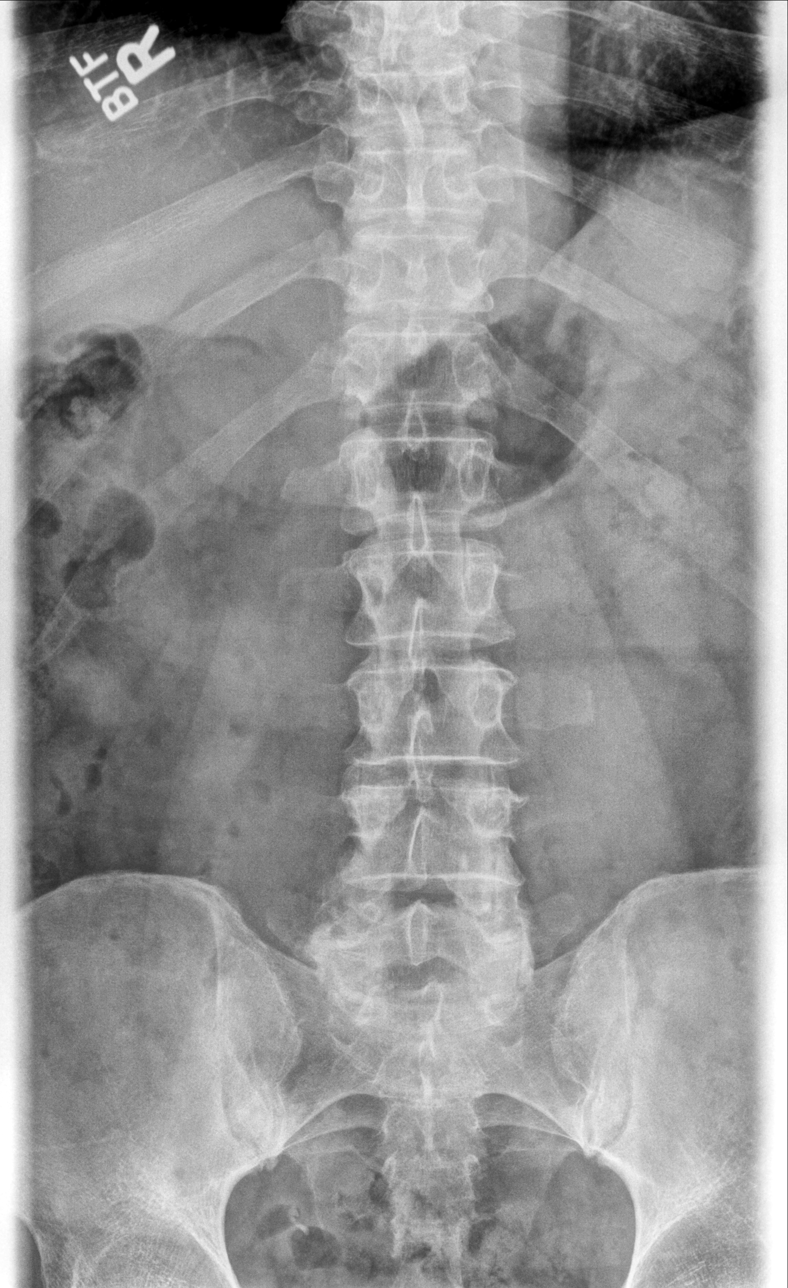

[lumbar spine oblique (1 of 2)]
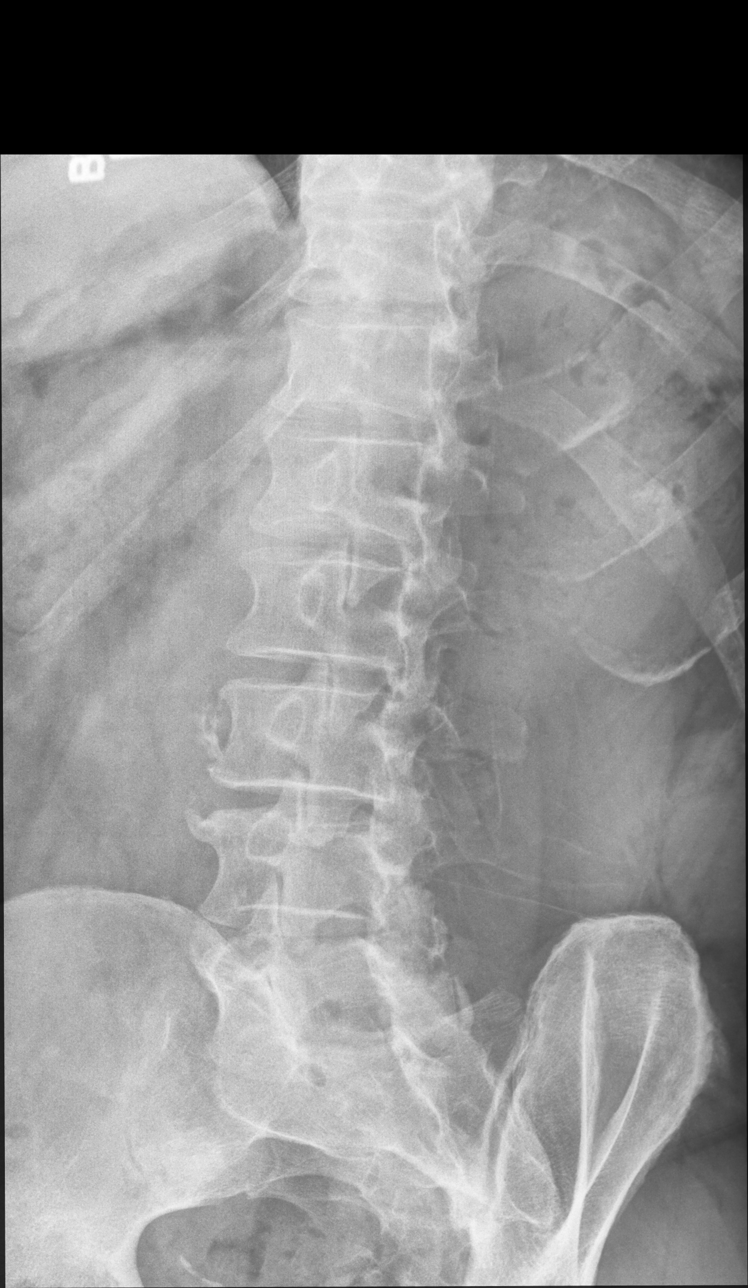

[lumbar spine oblique (2 of 2)]
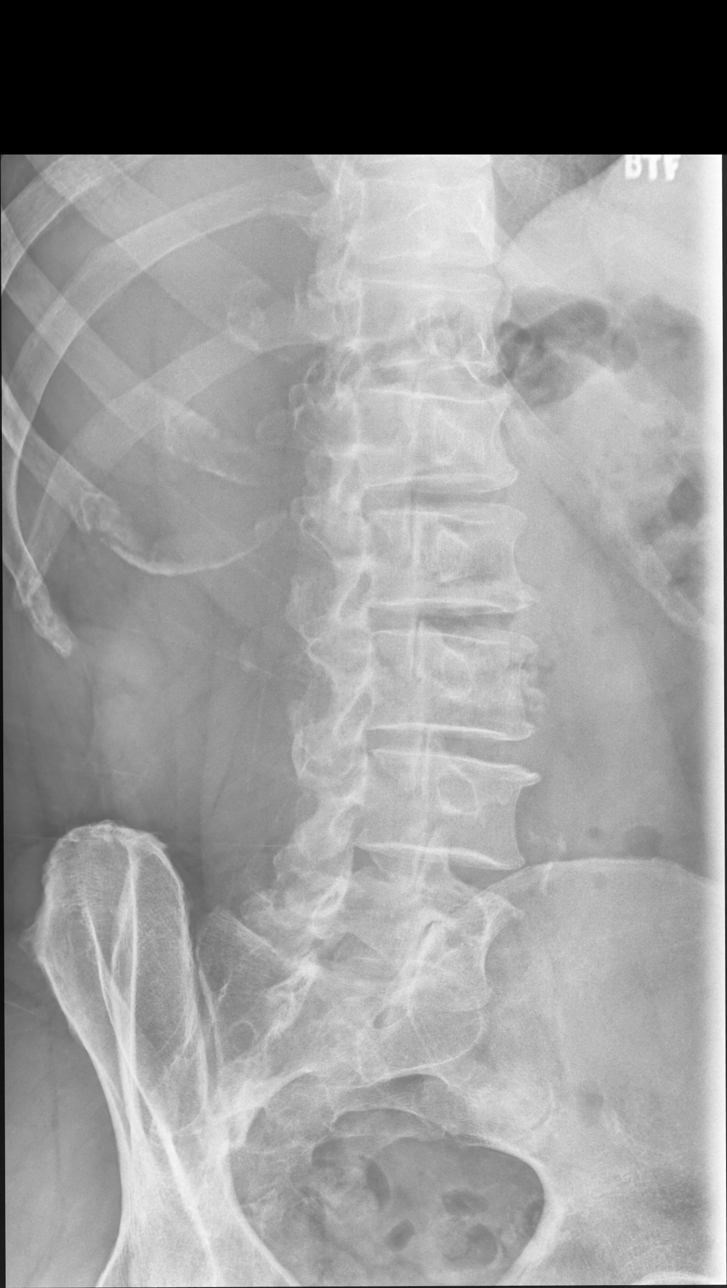

[lumbar spine lat (1 of 2)]
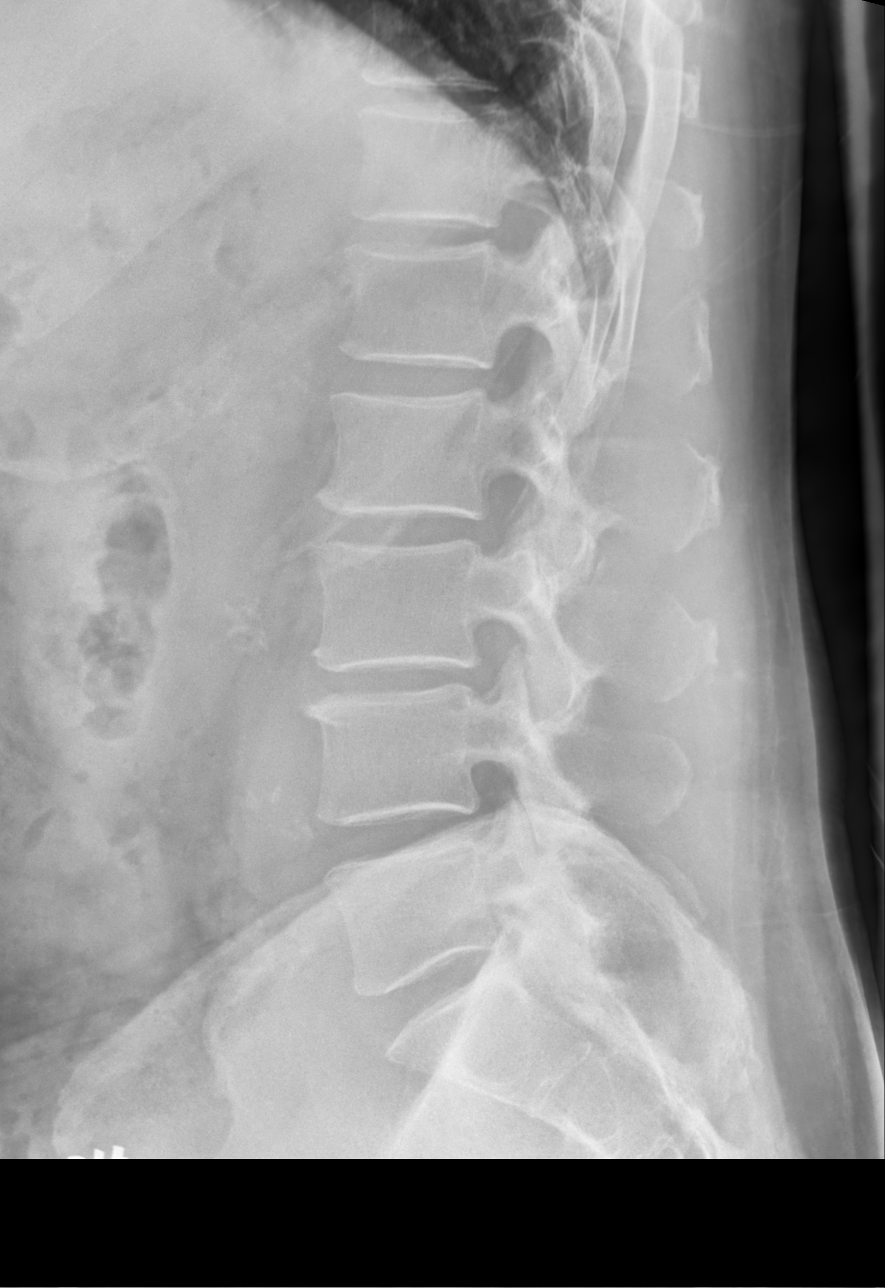

[lumbar spine lat (2 of 2)]
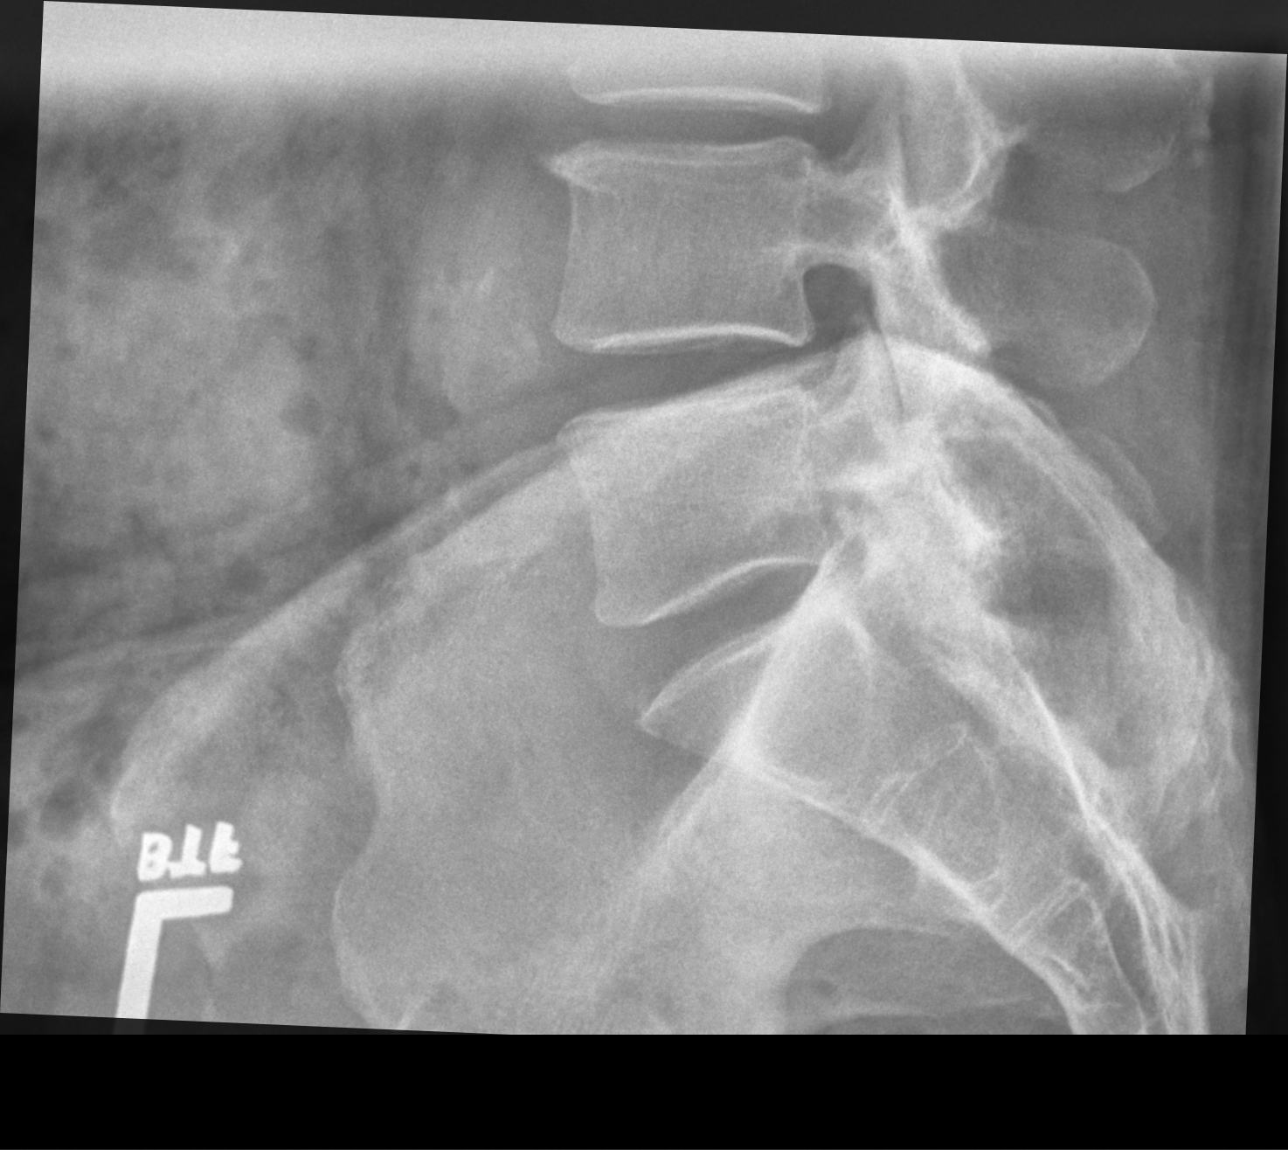

[5 of 5 positions shown; findings below may reference images not displayed]

FINDINGS: The lumbar vertebrae are normal alignment. There is mild
degenerative disc disease at L3-4 with there is slight loss of disc
space and mild spurring. No compression deformity is seen. On
oblique views there is some degenerative change of the facet joints
particularly of L5-S1. The SI joints are well corticated. The bowel
gas pattern is nonspecific.
IMPRESSION: 1. Normal alignment with mild degenerative disc disease at L3-4.
2. Degenerative change of the facet joints of L5-S1.

## 2023-01-25 ENCOUNTER — Encounter (HOSPITAL_BASED_OUTPATIENT_CLINIC_OR_DEPARTMENT_OTHER): Payer: Self-pay

## 2023-01-25 ENCOUNTER — Emergency Department (HOSPITAL_BASED_OUTPATIENT_CLINIC_OR_DEPARTMENT_OTHER): Payer: BC Managed Care – PPO

## 2023-01-25 ENCOUNTER — Emergency Department (HOSPITAL_BASED_OUTPATIENT_CLINIC_OR_DEPARTMENT_OTHER)
Admission: EM | Admit: 2023-01-25 | Discharge: 2023-01-25 | Disposition: A | Discharge status: Home / Self Care | Payer: BC Managed Care – PPO | Attending: Emergency Medicine | Admitting: Emergency Medicine

## 2023-01-25 ENCOUNTER — Other Ambulatory Visit: Payer: Self-pay

## 2023-01-25 DIAGNOSIS — G8929 Other chronic pain: Secondary | ICD-10-CM | POA: Insufficient documentation

## 2023-01-25 DIAGNOSIS — I10 Essential (primary) hypertension: Secondary | ICD-10-CM | POA: Diagnosis not present

## 2023-01-25 DIAGNOSIS — M25511 Pain in right shoulder: Secondary | ICD-10-CM | POA: Insufficient documentation

## 2023-01-25 MED ORDER — HYDROCODONE-ACETAMINOPHEN 5-325 MG PO TABS
1.0000 | ORAL_TABLET | Freq: Once | ORAL | Status: AC
  Administered 2023-01-25: 1 via ORAL
  Filled 2023-01-25: qty 1

## 2023-01-25 MED ORDER — LIDOCAINE 5 % EX PTCH: 1.0000 | MEDICATED_PATCH | Freq: Every day | CUTANEOUS | 0 refills | Status: AC | PRN

## 2023-01-25 MED ORDER — IBUPROFEN 600 MG PO TABS: 600.0000 mg | ORAL_TABLET | Freq: Four times a day (QID) | ORAL | 0 refills | Status: AC | PRN

## 2023-01-25 MED ORDER — KETOROLAC TROMETHAMINE 15 MG/ML IJ SOLN
15.0000 mg | Freq: Once | INTRAMUSCULAR | Status: AC
  Administered 2023-01-25: 15 mg via INTRAMUSCULAR
  Filled 2023-01-25: qty 1

## 2023-01-25 MED ORDER — LIDOCAINE 5 % EX PTCH
1.0000 | MEDICATED_PATCH | Freq: Once | CUTANEOUS | Status: DC
  Administered 2023-01-25: 1 via TRANSDERMAL
  Filled 2023-01-25: qty 1

## 2023-01-25 MED ORDER — OXYCODONE HCL 5 MG PO TABS: 5.0000 mg | ORAL_TABLET | Freq: Four times a day (QID) | ORAL | 0 refills | Status: AC | PRN

## 2023-01-25 NOTE — ED Triage Notes (Signed)
 Pt c/o right shoulder pain for the past two days.

## 2023-01-25 NOTE — ED Provider Notes (Addendum)
 [facility] Provider Note  CSN: [id number] Arrival date & time: 01/25/23 9261  Chief Complaint(s) Shoulder Pain  HPI Andrew Pollard is a 68 y.o. male with past medical history as below, significant for alcohol abuse, HLD, HTN, chronic neck and back pain, anxiety depression who presents to the ED with complaint of shoulder pain.  Patient reports he had an accident multiple years ago and injured his right shoulder which was subsequently repaired.  Reports that his shoulder bones are out of position and is a chronic pain since then.  Worsening pain over the past 2 days, no recent falls or injuries.  He has been taking acetaminophen  and Flexeril , homeopathic supplements.  Pain is worsened.  No other injuries reported, no other pain reported, no neck or back pain is new.  No chest pain or dyspnea.  No fevers.  Past Medical History Past Medical History:  Diagnosis Date   Alcoholism /alcohol abuse    Anxiety and depression    Colon polyps    Hepatitis B 1977   Hyperlipidemia    Hypertension    Patient Active Problem List   Diagnosis Date Noted   Hepatomegaly 06/08/2016   Acute bilateral low back pain without sciatica 06/08/2016   Prostate cancer screening 05/15/2015   Cervical neck pain with evidence of disc disease 12/24/2013   Hypertension 01/24/2013   Anxiety and depression 01/24/2013   Visit for preventive health examination 01/24/2013   Home Medication(s) Prior to Admission medications   Medication Sig Start Date End Date Taking? Authorizing Provider  ibuprofen  (ADVIL ) 600 MG tablet Take 1 tablet (600 mg total) by mouth every 6 (six) hours as needed. 01/25/23  Yes Elnor Savant A, DO  lidocaine  (LIDODERM ) 5 % Place 1 patch onto the skin daily as needed. Remove & Discard patch within 12 hours or as directed by MD 01/25/23  Yes Elnor Savant A, DO  oxyCODONE  (ROXICODONE ) 5 MG immediate release tablet Take 1 tablet (5 mg total) by mouth  every 6 (six) hours as needed. 01/25/23  Yes Elnor Savant A, DO  cyclobenzaprine  (FLEXERIL ) 10 MG tablet Take 1 tablet (10 mg total) by mouth 3 (three) times daily as needed for muscle spasms. 03/22/16   Gladis Elsie BROCKS, PA-C  fluticasone  (FLONASE ) 50 MCG/ACT nasal spray Place 2 sprays into both nostrils daily. 01/03/17   Gladis Elsie BROCKS, PA-C  metoprolol  succinate (TOPROL -XL) 25 MG 24 hr tablet TAKE 1 TABLET(25 MG) BY MOUTH DAILY 12/30/16   Gladis Elsie BROCKS, PA-C  pregabalin  (LYRICA ) 75 MG capsule Take 1 capsule (75 mg total) by mouth 2 (two) times daily. 05/21/17   Gladis Elsie BROCKS DEVONNA                                                                                                                                    Past Surgical History Past Surgical History:  Procedure Laterality Date  APPENDECTOMY  1970   LIVER BIOPSY  1977   Hepatitis B   WISDOM TOOTH EXTRACTION     Family History Family History  Problem Relation Age of Onset   Cancer Father 77       Stage IV Liver Cancer with mets to Colon   Aneurysm Mother        Heart   Hyperlipidemia Mother    Hypertension Mother    Epilepsy Brother    Healthy Brother        x1   Healthy Son        x2    Social History Social History   Tobacco Use   Smoking status: Every Day    Current packs/day: 1.50    Average packs/day: 1.5 packs/day for 30.0 years (45.0 ttl pk-yrs)    Types: Cigarettes   Smokeless tobacco: Never  Substance Use Topics   Alcohol use: Yes    Alcohol/week: 10.0 standard drinks of alcohol    Types: 10 Cans of beer per week   Drug use: No   Allergies Citalopram  and Sulfa antibiotics  Review of Systems Review of Systems  Constitutional:  Negative for chills and fever.  Respiratory:  Negative for chest tightness and shortness of breath.   Cardiovascular:  Negative for chest pain and palpitations.  Gastrointestinal:  Negative for abdominal pain and nausea.  Musculoskeletal:  Positive for arthralgias.   Neurological:  Negative for weakness and headaches.  All other systems reviewed and are negative.   Physical Exam Vital Signs  I have reviewed the triage vital signs BP (!) 137/95   Pulse 83   Temp 98.7 F (37.1 C)   Resp 18   SpO2 98%  Physical Exam Vitals and nursing note reviewed.  Constitutional:      General: He is not in acute distress.    Appearance: He is well-developed.  HENT:     Head: Normocephalic and atraumatic.     Right Ear: External ear normal.     Left Ear: External ear normal.     Mouth/Throat:     Mouth: Mucous membranes are moist.  Eyes:     General: No scleral icterus. Cardiovascular:     Rate and Rhythm: Normal rate and regular rhythm.     Pulses: Normal pulses.     Heart sounds: Normal heart sounds.  Pulmonary:     Effort: Pulmonary effort is normal. No respiratory distress.     Breath sounds: Normal breath sounds.  Abdominal:     General: Abdomen is flat.     Palpations: Abdomen is soft.     Tenderness: There is no abdominal tenderness.  Musculoskeletal:     Right shoulder: Tenderness present. No swelling or deformity.     Left shoulder: No swelling, deformity or tenderness.       Arms:     Cervical back: No rigidity.     Right lower leg: No edema.     Left lower leg: No edema.     Comments: He has full active range of motion to his right upper extremity.  Right upper extremity is NVI.  He is tender diffusely along his entire shoulder, no obvious external abnormalities or bruising. He is able to actively lift his RUE over his head. Some pain with hawkin's < neer's  Skin:    General: Skin is warm and dry.     Capillary Refill: Capillary refill takes less than 2 seconds.  Neurological:     Mental Status: He is  alert.  Psychiatric:        Mood and Affect: Mood normal.        Behavior: Behavior normal.     ED Results and Treatments Labs (all labs ordered are listed, but only abnormal results are displayed) Labs Reviewed - No data to  display                                                                                                                        Radiology DG Shoulder Right Result Date: 01/25/2023 CLINICAL DATA:  RIGHT shoulder pain EXAM: RIGHT SHOULDER - 2+ VIEW COMPARISON:  None Available. FINDINGS: No fracture or dislocation of the RIGHT shoulder. There is superior migration of the humeral head in the glenoid fossa consistent with rotator cuff deficiency. Bulky osteophyte extending from the inferior margin of the RIGHT humeral head. IMPRESSION: 1. No fracture dislocation. 2. Severe shoulder arthropathy. Electronically Signed   By: Jackquline Boxer M.D.   On: 01/25/2023 08:20    Pertinent labs & imaging results that were available during my care of the patient were reviewed by me and considered in my medical decision making (see MDM for details).  Medications Ordered in ED Medications  lidocaine  (LIDODERM ) 5 % 1 patch (1 patch Transdermal Patch Applied 01/25/23 0759)  ketorolac  (TORADOL ) 15 MG/ML injection 15 mg (15 mg Intramuscular Given 01/25/23 0759)  HYDROcodone -acetaminophen  (NORCO/VICODIN) 5-325 MG per tablet 1 tablet (1 tablet Oral Given 01/25/23 0759)                                                                                                                                     Procedures Procedures  (including critical care time)  Medical Decision Making / ED Course    Medical Decision Making:    Mikyle Sox is a 68 y.o. male with past medical history as below, significant for alcohol abuse, HLD, HTN, chronic neck and back pain, anxiety depression who presents to the ED with complaint of shoulder pain.. The complaint involves an extensive differential diagnosis and also carries with it a high risk of complications and morbidity.  Serious etiology was considered. Ddx includes but is not limited to: Sprain, strain, fracture, cervicalgia, radiculopathy, neuropathy, neoplasm, malingering  etc.  Complete initial physical exam performed, notably the patient was in no distress, amatory steady gait, speaking clearly in full sentences.    Reviewed and confirmed nursing documentation for past  medical history, family history, social history.  Vital signs reviewed.         Brief summary: 68 year old male history as above here with recurrent right shoulder pain, worse in the past 2 days.  Right upper extremity is NVI.  Will get x-ray, sling, analgesia, follow-up with orthopedics was encouraged.  On recheck he is ambulatory, walking around room. Able to put his sweater back on without difficulty. Pain moderately improved. XR with chronic changes. Plan dc  The patient improved significantly and was discharged in stable condition. Detailed discussions were had with the patient regarding current findings, and need for close f/u with PCP or on call doctor. The patient has been instructed to return immediately if the symptoms worsen in any way for re-evaluation. Patient verbalized understanding and is in agreement with current care plan. All questions answered prior to discharge.                Additional history obtained: -Additional history obtained from family -External records from outside source obtained and reviewed including: Chart review including previous notes, labs, imaging, consultation notes including  ED NP, primary care documentation, prior home medications   Lab Tests: na  EKG   EKG Interpretation Date/Time:    Ventricular Rate:    PR Interval:    QRS Duration:    QT Interval:    QTC Calculation:   R Axis:      Text Interpretation:           Imaging Studies ordered: I ordered imaging studies including shoulder right I independently visualized the following imaging with scope of interpretation limited to determining acute life threatening conditions related to emergency care; findings noted above I independently visualized and interpreted  imaging. I agree with the radiologist interpretation   Medicines ordered and prescription drug management: Meds ordered this encounter  Medications   ketorolac  (TORADOL ) 15 MG/ML injection 15 mg   HYDROcodone -acetaminophen  (NORCO/VICODIN) 5-325 MG per tablet 1 tablet    Refill:  0   lidocaine  (LIDODERM ) 5 % 1 patch   ibuprofen  (ADVIL ) 600 MG tablet    Sig: Take 1 tablet (600 mg total) by mouth every 6 (six) hours as needed.    Dispense:  30 tablet    Refill:  0   oxyCODONE  (ROXICODONE ) 5 MG immediate release tablet    Sig: Take 1 tablet (5 mg total) by mouth every 6 (six) hours as needed.    Dispense:  5 tablet    Refill:  0   lidocaine  (LIDODERM ) 5 %    Sig: Place 1 patch onto the skin daily as needed. Remove & Discard patch within 12 hours or as directed by MD    Dispense:  15 patch    Refill:  0    -I have reviewed the patients home medicines and have made adjustments as needed   Consultations Obtained: na   Cardiac Monitoring: Continuous pulse oximetry interpreted by myself, 100% on RA.    Social Determinants of Health:  Diagnosis or treatment significantly limited by social determinants of health: current smoker and alcohol use Counseled patient for approximately 3 minutes regarding smoking cessation. Discussed risks of smoking and how they applied and affected their visit here today. Patient not ready to quit at this time, however will follow up with their primary doctor when they are.   CPT code: 00593: intermediate counseling for smoking cessation   Recommend etoh reduction     Reevaluation: After the interventions noted above, I reevaluated the patient  and found that they have improved  Co morbidities that complicate the patient evaluation  Past Medical History:  Diagnosis Date   Alcoholism /alcohol abuse    Anxiety and depression    Colon polyps    Hepatitis B 1977   Hyperlipidemia    Hypertension       Dispostion: Disposition decision  including need for hospitalization was considered, and patient discharged from emergency department.    Final Clinical Impression(s) / ED Diagnoses Final diagnoses:  Chronic pain in right shoulder        Elnor Jayson LABOR, DO 01/25/23 0843    Elnor Jayson LABOR, DO 01/25/23 0845

## 2023-01-25 NOTE — Discharge Instructions (Addendum)
 No heavy lifting next 5 days to right arm  Please cut back on alcohol use  Please stop smoking  Follow up with orthopedics in next 2 weeks   It was a pleasure caring for you today in the emergency department.  Please return to the emergency department for any worsening or worrisome symptoms.
